# Patient Record
Sex: Male | Born: 1950
Health system: Southern US, Community
[De-identification: ages and names within clinical notes are randomized; demographics above are authoritative.]

## PROBLEM LIST (undated history)

## (undated) DIAGNOSIS — E039 Hypothyroidism, unspecified: Secondary | ICD-10-CM

## (undated) DIAGNOSIS — J45909 Unspecified asthma, uncomplicated: Secondary | ICD-10-CM

## (undated) DIAGNOSIS — Z8719 Personal history of other diseases of the digestive system: Secondary | ICD-10-CM

## (undated) DIAGNOSIS — J189 Pneumonia, unspecified organism: Secondary | ICD-10-CM

## (undated) DIAGNOSIS — D408 Neoplasm of uncertain behavior of other specified male genital organs: Secondary | ICD-10-CM

## (undated) DIAGNOSIS — J342 Deviated nasal septum: Secondary | ICD-10-CM

## (undated) DIAGNOSIS — E785 Hyperlipidemia, unspecified: Secondary | ICD-10-CM

## (undated) DIAGNOSIS — K5730 Diverticulosis of large intestine without perforation or abscess without bleeding: Secondary | ICD-10-CM

## (undated) HISTORY — PX: COLON SURGERY: SHX602

## (undated) HISTORY — DX: Hypothyroidism, unspecified: E03.9

## (undated) HISTORY — PX: INGUINAL HERNIA REPAIR: SUR1180

## (undated) HISTORY — DX: Hyperlipidemia, unspecified: E78.5

## (undated) HISTORY — DX: Pneumonia, unspecified organism: J18.9

## (undated) HISTORY — PX: HERNIA REPAIR: SHX51

---
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
## 1971-01-11 HISTORY — PX: PILONIDAL CYST EXCISION: SHX744

## 1980-01-11 HISTORY — PX: NASAL SEPTUM SURGERY: SHX37

## 1982-01-10 HISTORY — PX: HYDROCELE EXCISION: SHX482

## 2005-02-03 ENCOUNTER — Ambulatory Visit: Payer: Self-pay | Admitting: Cardiology

## 2005-02-07 ENCOUNTER — Emergency Department (HOSPITAL_COMMUNITY): Admission: EM | Admit: 2005-02-07 | Discharge: 2005-02-07 | Payer: Self-pay | Admitting: Emergency Medicine

## 2005-10-27 ENCOUNTER — Emergency Department (HOSPITAL_COMMUNITY): Admission: EM | Admit: 2005-10-27 | Discharge: 2005-10-27 | Payer: Self-pay | Admitting: Family Medicine

## 2008-03-17 ENCOUNTER — Ambulatory Visit (HOSPITAL_COMMUNITY): Admission: RE | Admit: 2008-03-17 | Discharge: 2008-03-17 | Payer: Self-pay | Admitting: Internal Medicine

## 2008-04-02 ENCOUNTER — Encounter: Admission: RE | Admit: 2008-04-02 | Discharge: 2008-04-02 | Payer: Self-pay | Admitting: Internal Medicine

## 2008-10-16 ENCOUNTER — Encounter: Admission: RE | Admit: 2008-10-16 | Discharge: 2008-10-16 | Payer: Self-pay | Admitting: Internal Medicine

## 2009-01-05 ENCOUNTER — Encounter: Payer: Self-pay | Admitting: Internal Medicine

## 2009-01-06 ENCOUNTER — Encounter (INDEPENDENT_AMBULATORY_CARE_PROVIDER_SITE_OTHER): Payer: Self-pay | Admitting: *Deleted

## 2009-02-19 ENCOUNTER — Ambulatory Visit: Payer: Self-pay | Admitting: Internal Medicine

## 2009-02-19 DIAGNOSIS — K5732 Diverticulitis of large intestine without perforation or abscess without bleeding: Secondary | ICD-10-CM | POA: Insufficient documentation

## 2009-02-19 DIAGNOSIS — R197 Diarrhea, unspecified: Secondary | ICD-10-CM | POA: Insufficient documentation

## 2009-02-19 DIAGNOSIS — R933 Abnormal findings on diagnostic imaging of other parts of digestive tract: Secondary | ICD-10-CM | POA: Insufficient documentation

## 2009-02-20 ENCOUNTER — Encounter: Payer: Self-pay | Admitting: Internal Medicine

## 2009-03-04 ENCOUNTER — Telehealth: Payer: Self-pay | Admitting: Internal Medicine

## 2009-03-04 ENCOUNTER — Ambulatory Visit: Payer: Self-pay | Admitting: Internal Medicine

## 2009-03-04 DIAGNOSIS — R1084 Generalized abdominal pain: Secondary | ICD-10-CM | POA: Insufficient documentation

## 2009-03-04 LAB — CONVERTED CEMR LAB: BUN: 14 mg/dL (ref 6–23)

## 2009-03-06 ENCOUNTER — Ambulatory Visit: Payer: Self-pay | Admitting: Internal Medicine

## 2009-03-06 ENCOUNTER — Encounter (INDEPENDENT_AMBULATORY_CARE_PROVIDER_SITE_OTHER): Payer: Self-pay | Admitting: *Deleted

## 2009-03-09 ENCOUNTER — Ambulatory Visit: Payer: Self-pay | Admitting: Internal Medicine

## 2009-03-12 ENCOUNTER — Ambulatory Visit: Payer: Self-pay | Admitting: Internal Medicine

## 2009-08-03 ENCOUNTER — Ambulatory Visit (HOSPITAL_COMMUNITY): Admission: RE | Admit: 2009-08-03 | Discharge: 2009-08-03 | Payer: Self-pay | Admitting: Internal Medicine

## 2009-08-03 LAB — HM DEXA SCAN: HM Dexa Scan: NORMAL

## 2009-09-18 ENCOUNTER — Encounter: Admission: RE | Admit: 2009-09-18 | Discharge: 2009-09-18 | Payer: Self-pay | Admitting: Internal Medicine

## 2009-09-18 ENCOUNTER — Ambulatory Visit (HOSPITAL_COMMUNITY): Admission: RE | Admit: 2009-09-18 | Discharge: 2009-09-18 | Payer: Self-pay | Admitting: Internal Medicine

## 2009-10-19 ENCOUNTER — Telehealth: Payer: Self-pay | Admitting: Internal Medicine

## 2010-01-31 ENCOUNTER — Encounter: Payer: Self-pay | Admitting: Internal Medicine

## 2010-02-09 NOTE — Assessment & Plan Note (Signed)
Summary: ABDOMINAL PAIN AND DIARRHEA   History of Present Illness Visit Type: consult  Primary GI MD: Yancey Flemings MD Primary Provider: Marisue Brooklyn, MD  Requesting Provider: Lucky Cowboy, MD  Chief Complaint: Generalized abd pain, acid reflux, belching, bloating, chest pain, and diarrhea  History of Present Illness:   60 year old white male with history ofhypothyroidism, hyperlipidemia, and asthma. He is sent today by Dr. Elisabeth Most for evaluation of abdominal pain and change of bowel habits. The patient's history dates back to November 12, 2008 when after eating fish and a whole artichoke he developed abdominal cramping and diarrhea. He was concerned that he may have a parasite. He saw Dr. Elisabeth Most on December 3 and was prescribed a probiotic. Symptoms waxed and waned through December until the began to develop focal left-sided abdominal pain. On January 05, 2009 a CT scan of the abdomen and pelvis was performed (reviewed) and revealed probable sigmoid diverticulitis without complicating features. Review of blood work that same day revealed a normal CBC, comprehensive metabolic panel, amylase, lipase, and TSH. He was treated with metronidazole and ciprofloxacin for one week with improvement in pain. Over the past month his bowel frequency has improved from 6-8 loose stools per day to 1-2. Or form over the past week. Some recurrence or worsening of the left-sided pain as previous. No nausea, vomiting, GI bleeding, or weight loss. He reports a negative screening colonoscopy, elsewhere, and 2003.also, a tendency toward IBS.   GI Review of Systems    Reports bloating.     Location of  Abdominal pain: left-sided.    Denies abdominal pain, acid reflux, belching, chest pain, dysphagia with liquids, dysphagia with solids, heartburn, loss of appetite, nausea, vomiting, vomiting blood, weight loss, and  weight gain.      Reports change in bowel habits, diarrhea, diverticulosis, and  irritable bowel  syndrome.     Denies anal fissure, black tarry stools, constipation, fecal incontinence, heme positive stool, hemorrhoids, jaundice, light color stool, liver problems, rectal bleeding, and  rectal pain.    Current Medications (verified): 1)  Aspir-Low 81 Mg Tbec (Aspirin) .... Two Tablets By Mouth Once Daily 2)  Coq10 100 Mg Caps (Coenzyme Q10) .Marland Kitchen.. 1 Tablet By Mouth Once Daily 3)  Synthroid 50 Mcg Tabs (Levothyroxine Sodium) .Marland Kitchen.. 1 Tablet By Mouth Once Daily 4)  Centrum  Tabs (Multiple Vitamins-Minerals) .Marland Kitchen.. 1 Tablet By Mouth Once Daily 5)  Vitamin C 1000 Mg Tabs (Ascorbic Acid) .... One Tablet By Mouth Once Daily 6)  Calcium-Vitamin D3 600-200 Mg-Unit Tabs (Calcium Carbonate-Vitamin D) .... One Tablet By Mouth Once Daily 7)  Sm Natural Omega-3 Fish Oil  Caps (Fish Oil-Borage-Flax-Safflower) .... One Tablet By Mouth Once Daily 8)  Calcium-Magnesium-Zinc 500-250-12.5 Mg Tabs (Calcium-Magnesium-Zinc) .... One Tablet By Mouth Two Times A Day 9)  B-100  Tabs (Vitamins-Lipotropics) .... One Tablet By Mouth Once Daily 10)  Vitamin B-12 1000 Mcg Tabs (Cyanocobalamin) .... One Tablet By Mouth Once Daily 11)  Androgel 25 Mg/2.5gm Gel (Testosterone) .... As Directed 12)  Flax Seed Oil 1000 Mg Caps (Flaxseed (Linseed)) .... One Tablet By Mouth Two Times A Day  Allergies (verified): No Known Drug Allergies  Past History:  Past Medical History: Hypothyroidism Anal Fissure Asthma Diverticulosis Hyperlipidemia Irritable Bowel Syndrome Pneumonia  Past Surgical History: Reviewed history from 02/16/2009 and no changes required. Tonsillectomy Hernia Surgery x 2 pilonidal cysts x 3 Nose Surgery Foot Surgery Deviated Septum Surgery Rt. Testicle and Lt. Testicle  Family History: Family History of Breast Cancer:Mother Family  History of Prostate Cancer:Paternal Uncle and Father No FH of Colon Cancer: Family History of Heart Disease: Father and MGF   Social History: Occupation: Self  Employed (puts on furniture shows) Married  No childern Patient has never smoked.  Alcohol Use - yes: 2 daily  Daily Caffeine Use: 3-4 daily  Illicit Drug Use - no Smoking Status:  never Drug Use:  no  Review of Systems       fatigue, ankle swelling. All other systems reviewed and reported as negative  Vital Signs:  Patient profile:   60 year old male Height:      67 inches Weight:      234 pounds BMI:     36.78 BSA:     2.16 Pulse rate:   74 / minute Pulse rhythm:   regular BP sitting:   126 / 80  (left arm) Cuff size:   regular  Vitals Entered By: Ok Anis CMA (February 19, 2009 11:05 AM)  Physical Exam  General:  Well developed, well nourished, no acute distress. Head:  Normocephalic and atraumatic. Eyes:  PERRLA, no icterus. Ears:  Normal auditory acuity. Nose:  No deformity, discharge,  or lesions. Mouth:  No deformity or lesions, dentition normal. Neck:  Supple; no masses or thyromegaly. Lungs:  Clear throughout to auscultation. Heart:  Regular rate and rhythm; no murmurs, rubs,  or bruits. Abdomen:  Softand nondistended. Some tenderness in the left upper quadrant to palpation. No rebound.. No masses, hepatosplenomegaly or hernias noted. Normal bowel sounds. Msk:  Symmetrical with no gross deformities. Normal posture. Pulses:  Normal pulses noted. Extremities:  trace edema left ankle Neurologic:  Alert and  oriented x4;  grossly normal neurologically. Skin:  Intact without significant lesions or rashes. Psych:  Alert and cooperative. Normal mood and affect.   Impression & Recommendations:  Problem # 1:  DIARRHEA-PRESUMED INFECTIOUS (ICD-009.3) Initial illness with abdominal cramping and diarrhea consistent with infectious gastroenteritis. Subsequent symptoms suggesting postinfectious irritable bowel type picture. There has been gradual and ongoing improvement with time. I expect this trend to continue.  Plan: #1. Prescribed Levbid 0.375 mg p.o. b.i.d.  p.r.n. #2. Probiotic align one daily for 2 weeks. Samples given #3. Reassurance #4. Routine office followup in 4-6 weeks  Problem # 2:  DIVERTICULITIS-COLON (ICD-562.11) Diverticulitis. Both clinically and by CT scan. He seems to have improved after one week of antibiotic therapy. Now with what sounds like smoldering symptoms and possible early recurrence.  Plan: #1. Prescribe Cipro 500 mg b.i.d. x10 days #2. Prescribe metronidazole 500 mg p.o. b.i.d. x10 days #3. Office followup in 4-6 weeks. Contact the office in the interim for questions or problems  Problem # 3:  ABNORMAL FINDINGS GI TRACT (ICD-793.4) Assessment: Comment Only abnormal findings consistent with diverticulitis. Treatment plan as above.  Patient Instructions: 1)  Please pick up your medications at your pharmacy. 2)  Samples of Align given.  Take this for 2 weeks. 3)  Please schedule a follow-up appointment in 4 to 6 weeks.  4)  Copy sent to :Marisue Brooklyn, DO 5)  The medication list was reviewed and reconciled.  All changed / newly prescribed medications were explained.  A complete medication list was provided to the patient / caregiver. Prescriptions: FLAGYL 500 MG TABS (METRONIDAZOLE) Take 1 tablet by mouth two times a day for 10 days  #20 x 0   Entered by:   Francee Piccolo CMA (AAMA)   Authorized by:   Hilarie Fredrickson MD   Signed by:  Hilarie Fredrickson MD on 02/19/2009   Method used:   [facility] [address] [phone]* (retail)       [address]       [place], [place]  [address]       Ph: [phone] or [phone]       Fax: [phone]   RxID:   [id number] CIPRO 500 MG TABS (CIPROFLOXACIN HCL) Take 1 tablet by mouth two times a day for 10 days  #20 x 0   Entered by:   Francee Piccolo CMA (AAMA)   Authorized by:   Hilarie Fredrickson MD   Signed by:   Hilarie Fredrickson MD on 02/19/2009   Method used:   [facility] to        [facility] [address] [phone]*  (retail)       [address]       [place], [place]  [address]       Ph: [phone] or [phone]       Fax: [phone]   RxID:   [id number] LEVBID 0.375 MG XR12H-TAB (HYOSCYAMINE SULFATE) Take 1 tablet by mouth two times a day as needed  #60 x 0   Entered by:   Francee Piccolo CMA (AAMA)   Authorized by:   Hilarie Fredrickson MD   Signed by:   Hilarie Fredrickson MD on 02/19/2009   Method used:   [facility]ically to        [facility] [address] [phone]* (retail)       [address]       [place], [place]  [address]       Ph: [phone] or [phone]       Fax: [phone]   RxID:   [id number]

## 2010-02-09 NOTE — Progress Notes (Signed)
Summary: billing concern  Phone Note Call from Patient Call back at Home Phone [phone]   Caller: wife Call For: Dr. Marina Goodell Reason for Call: Talk to Nurse Summary of Call: billing complications and trouble with billing manager, "Victorino Dike"... specifically asked to speak to the office manager with Dr. Marina Goodell... doesnt think that our office is getting paid as pt and wife have not been ale to make a payment with the new billing dept Initial call taken by: Vallarie Mare,  October 19, 2009 8:51 AM  Follow-up for Phone Call        Left message on patient's answering machine to call me back. Follow-up by: Clarnce Flock,  October 22, 2009 9:33 AM  Additional Follow-up for Phone Call Additional follow up Details #1::        Left another message to call me if they are still in need of help with the billing office. Additional Follow-up by: Clarnce Flock,  October 28, 2009 4:07 PM

## 2010-02-09 NOTE — Procedures (Signed)
Summary: Colonoscopy  Patient: [name] Note: All result statuses are Final unless otherwise noted.  Tests: (1) Colonoscopy (COL)   COL Colonoscopy           DONE     [facility]     [address]     [place], [place]  [address]           COLONOSCOPY PROCEDURE REPORT           PATIENT:  Eric Cordova, Eric Cordova  MR#:  [id number]     BIRTHDATE:  [birth year], 59 yrs. old  GENDER:  male           ENDOSCOPIST:  Wilhemina Bonito. Eda Keys, MD     Referred by:  Marisue Brooklyn, D.O.           PROCEDURE DATE:  03/12/2009     PROCEDURE:  Surveillance Colonoscopy     ASA CLASS:  Class II     INDICATIONS:  history of hyperplastic polyps ; In [place] 8 years     ago (no details; patient not sure who performed the exam) ; Recent     diverticulitis.still w/ left sided pain but recent CT shows     resolution of diverticulitis           MEDICATIONS:   Fentanyl 50 mcg IV, Versed 7 mg IV, Benadryl 50 mg     IV           DESCRIPTION OF PROCEDURE:   After the risks benefits and     alternatives of the procedure were thoroughly explained, informed     consent was obtained.  Digital rectal exam was performed and     revealed no abnormalities.   The LB [id number] endoscope     was introduced through the anus and advanced to the cecum, which     was identified by both the appendix and ileocecal valve, without     limitations. Time to cecum = 2:08 min.The quality of the prep was     excellent, using MoviPrep.  The instrument was then slowly     withdrawn (time = 6:50 min) as the colon was fully examined.     <<PROCEDUREIMAGES>>           FINDINGS:  Severe diverticulosis was found throughout the colon.     This was otherwise a normal examination of the colon.  No polyps or     cancers were seen.   Retroflexed views in the rectum revealed no     abnormalities.    The scope was then withdrawn from the patient     and the procedure completed.           COMPLICATIONS:  None        ENDOSCOPIC IMPRESSION:     1) Severe diverticulosis throughout the colon     2) Otherwise normal examination     3) No polyps or cancers     RECOMMENDATIONS:     1) Continue current colorectal screening recommendations for     "routine risk" patients with a repeat colonoscopy in 10 years.     2) Office follow up in 2 months           ______________________________     Wilhemina Bonito. Eda Keys, MD           CC:  Marisue Brooklyn, DO; The Patient           n.     eSIGNED:  Wilhemina Bonito. Eda Keys at 03/12/2009 02:38 PM           Roylene Reason, [id number]  Note: An exclamation mark (!) indicates a result that was not dispersed into the flowsheet. Document Creation Date: 03/12/2009 2:38 PM _______________________________________________________________________  (1) Order result status: Final Collection or observation date-time: 03/12/2009 14:32 Requested date-time:  Receipt date-time:  Reported date-time:  Referring Physician:   Ordering Physician: Fransico Setters [phone]) Specimen Source:  Source: Launa Grill Order Number: [id number] Lab site:   Appended Document: Colonoscopy    Clinical Lists Changes  Observations: Added new observation of COLONNXTDUE: 03/2019 (03/12/2009 16:14)

## 2010-02-09 NOTE — Letter (Signed)
Summary: Financial trader  [employer] Authorization   Imported By: Roderic Ovens 04/08/2009 12:15:01  _____________________________________________________________________  External Attachment:    Type:   Image     Comment:   External Document

## 2010-02-09 NOTE — Miscellaneous (Signed)
Summary: Flagyl rx new pharm  Clinical Lists Changes  Medications: Rx of FLAGYL 500 MG TABS (METRONIDAZOLE) Take 1 tablet by mouth two times a day for 10 days;  #20 x 0;  Signed;  Entered by: Harlow Mares CMA (AAMA);  Authorized by: Hilarie Fredrickson MD;  Method used: Faxed to [facility], [address] [place], [place], [place]  [address], Ph: [phone], Fax: [phone]    Prescriptions: FLAGYL 500 MG TABS (METRONIDAZOLE) Take 1 tablet by mouth two times a day for 10 days  #20 x 0   Entered by:   Harlow Mares CMA (AAMA)   Authorized by:   Hilarie Fredrickson MD   Signed by:   Harlow Mares CMA (AAMA) on 02/20/2009   Method used:   Faxed to ...       [facility] (retail)       [address]       [place], [place]  [address]       Ph: [phone]       Fax: [phone]   RxID:   [id number]  gave patient new pharm info.

## 2010-02-09 NOTE — Letter (Signed)
Summary: [facility] Instructions  [facility]  [address] [place], [place] [address]   Phone: [phone]  Fax: [phone]       [facility]    [birth year]    MRN: [id number]        Procedure Day Dorna Bloom:  Lenor Coffin  03/12/09     Arrival Time:  1:00PM     Procedure Time:  2:00PM     Location of Procedure:                    _ X_  [facility] (4th Floor)                        PREPARATION FOR COLONOSCOPY WITH MOVIPREP   Starting 5 days prior to your procedure 03/07/09 do not eat nuts, seeds, popcorn, corn, beans, peas,  salads, or any raw vegetables.  Do not take any fiber supplements (e.g. Metamucil, Citrucel, and Benefiber).  THE DAY BEFORE YOUR PROCEDURE         DATE: 03/11/09  DAY: WEDNESDAY  1.  Drink clear liquids the entire day-NO SOLID FOOD  2.  Do not drink anything colored red or purple.  Avoid juices with pulp.  No orange juice.  3.  Drink at least 64 oz. (8 glasses) of fluid/clear liquids during the day to prevent dehydration and help the prep work efficiently.  CLEAR LIQUIDS INCLUDE: Water Jello Ice Popsicles Tea (sugar ok, no milk/cream) Powdered fruit flavored drinks Coffee (sugar ok, no milk/cream) Gatorade Juice: apple, white grape, white cranberry  Lemonade Clear bullion, consomm, broth Carbonated beverages (any kind) Strained chicken noodle soup Hard Candy                             4.  In the morning, mix first dose of MoviPrep solution:    Empty 1 Pouch A and 1 Pouch B into the disposable container    Add lukewarm drinking water to the top line of the container. Mix to dissolve    Refrigerate (mixed solution should be used within 24 hrs)  5.  Begin drinking the prep at 5:00 p.m. The MoviPrep container is divided by 4 marks.   Every 15 minutes drink the solution down to the next mark (approximately 8 oz) until the full liter is complete.   6.  Follow completed prep with 16 oz of clear liquid of your choice  (Nothing red or purple).  Continue to drink clear liquids until bedtime.  7.  Before going to bed, mix second dose of MoviPrep solution:    Empty 1 Pouch A and 1 Pouch B into the disposable container    Add lukewarm drinking water to the top line of the container. Mix to dissolve    Refrigerate  THE DAY OF YOUR PROCEDURE      DATE: 03/12/09  DAY: THURSDAY  Beginning at 9:00AM (5 hours before procedure):         1. Every 15 minutes, drink the solution down to the next mark (approx 8 oz) until the full liter is complete.  2. Follow completed prep with 16 oz. of clear liquid of your choice.    3. You may drink clear liquids until 12:00PM (2 HOURS BEFORE PROCEDURE).   MEDICATION INSTRUCTIONS  Unless otherwise instructed, you should take regular prescription medications with a small sip of water   as early as possible the morning  of your procedure.    Additional medication instructions: n/a         OTHER INSTRUCTIONS  You will need a responsible adult at least 59 years of age to accompany you and drive you home.   This person must remain in the waiting room during your procedure.  Wear loose fitting clothing that is easily removed.  Leave jewelry and other valuables at home.  However, you may wish to bring a book to read or  an iPod/MP3 player to listen to music as you wait for your procedure to start.  Remove all body piercing jewelry and leave at home.  Total time from sign-in until discharge is approximately 2-3 hours.  You should go home directly after your procedure and rest.  You can resume normal activities the  day after your procedure.  The day of your procedure you should not:   Drive   Make legal decisions   Operate machinery   Drink alcohol   Return to work  You will receive specific instructions about eating, activities and medications before you leave.    The above instructions have been reviewed and explained to me by  Sherren Kerns RN   March 09, 2009 9:53 AM      I fully understand and can verbalize these instructions _____________________________ Date _________

## 2010-02-09 NOTE — Miscellaneous (Signed)
Summary: previsit/rm  Clinical Lists Changes  Medications: Added new medication of MOVIPREP 100 GM  SOLR (PEG-KCL-NACL-NASULF-NA ASC-C) As per prep instructions. - Signed Rx of MOVIPREP 100 GM  SOLR (PEG-KCL-NACL-NASULF-NA ASC-C) As per prep instructions.;  #1 x 0;  Signed;  Entered by: Sherren Kerns RN;  Authorized by: Hilarie Fredrickson MD;  Method used: [facility] [address] [phone]*, [address], [place], [place], [place]  [address], Ph: [phone] or [phone], Fax: [phone] Observations: Added new observation of ALLERGY REV: Done (03/09/2009 9:28)    Prescriptions: MOVIPREP 100 GM  SOLR (PEG-KCL-NACL-NASULF-NA ASC-C) As per prep instructions.  #1 x 0   Entered by:   Sherren Kerns RN   Authorized by:   Hilarie Fredrickson MD   Signed by:   Sherren Kerns RN on 03/09/2009   Method used:   Electronically to        [facility] [address] [phone]* (retail)       [address]       [place], [place]  [address]       Ph: [phone] or [phone]       Fax: [phone]   RxID:   [id number]

## 2010-02-09 NOTE — Progress Notes (Signed)
Summary: triage / pain  Phone Note Call from Patient Call back at Work Phone [phone]   Caller: Patient Call For: Marina Goodell Reason for Call: Talk to Nurse Summary of Call: Patient states that he has "terrible pain" on his left side arouen his ric cage wants to know what to do.  Initial call taken by: Tawni Levy,  March 04, 2009 10:08 AM  Follow-up for Phone Call        Continues to have left sided abd. pain same as when seen Describes area as mostly under rib cage but does also occur in lower LLQ. Pain is described as throbbing and sharp with pain level currently a 3. Continues with loose stools of 1-3 a day.No blood or mucus seen. No shaking chills or fever.Finished medication yesterday. Follow-up by: Teryl Lucy RN,  March 04, 2009 10:22 AM  Additional Follow-up for Phone Call Additional follow up Details #1::        Let' set him up for a follow up CT of the abdomen and pelvis (with contrast) Additional Follow-up by: Hilarie Fredrickson MD,  March 04, 2009 11:15 AM  New Problems: ABDOMINAL PAIN, GENERALIZED (ICD-789.07)   New Problems: ABDOMINAL PAIN, GENERALIZED (ICD-789.07)  Appended Document: triage / pain  Pt. scheduled for CT at [facility] on Friday-03/06/2009 at 9:30 a.m. Will pick up drink and instructions and have labs done.

## 2010-03-29 ENCOUNTER — Ambulatory Visit (HOSPITAL_COMMUNITY)
Admission: RE | Admit: 2010-03-29 | Discharge: 2010-03-29 | Disposition: A | Discharge status: Home / Self Care | Payer: BC Managed Care – PPO | Source: Ambulatory Visit | Attending: Internal Medicine | Admitting: Internal Medicine

## 2010-03-29 ENCOUNTER — Other Ambulatory Visit (HOSPITAL_COMMUNITY): Payer: Self-pay | Admitting: Internal Medicine

## 2010-03-29 DIAGNOSIS — R05 Cough: Secondary | ICD-10-CM

## 2010-03-29 DIAGNOSIS — R059 Cough, unspecified: Secondary | ICD-10-CM | POA: Insufficient documentation

## 2010-10-07 ENCOUNTER — Other Ambulatory Visit: Payer: Self-pay

## 2010-10-07 DIAGNOSIS — M7989 Other specified soft tissue disorders: Secondary | ICD-10-CM

## 2010-10-12 ENCOUNTER — Encounter (INDEPENDENT_AMBULATORY_CARE_PROVIDER_SITE_OTHER): Payer: Self-pay

## 2010-10-12 ENCOUNTER — Other Ambulatory Visit (INDEPENDENT_AMBULATORY_CARE_PROVIDER_SITE_OTHER): Payer: Self-pay

## 2010-10-19 ENCOUNTER — Encounter (INDEPENDENT_AMBULATORY_CARE_PROVIDER_SITE_OTHER): Payer: Self-pay | Admitting: General Surgery

## 2010-10-19 ENCOUNTER — Ambulatory Visit (INDEPENDENT_AMBULATORY_CARE_PROVIDER_SITE_OTHER): Payer: BC Managed Care – PPO | Admitting: General Surgery

## 2010-10-19 VITALS — BP 132/90 | HR 64 | Temp 97.4°F | Resp 16 | Ht 67.0 in | Wt 216.0 lb

## 2010-10-19 DIAGNOSIS — K5730 Diverticulosis of large intestine without perforation or abscess without bleeding: Secondary | ICD-10-CM | POA: Insufficient documentation

## 2010-10-19 NOTE — Patient Instructions (Signed)
Try to retrieve pictures from previous colonoscopy from Dr. Lamar Sprinkles office. If we can get the barium enema performed this week we can plan for surgery in the near future based on these findings.

## 2010-10-19 NOTE — Progress Notes (Signed)
Chief Complaint  Patient presents with  . Other    new pt- eval diverticulitis    HPI Eric Cordova is a 60 y.[place]ordova. male.      HPI  The patient comes in with a history of recurrent bouts of diverticulitis. Looking through the patient's notes presented to me by Dr. Marisue Brooklyn the last CT scan noted in the patient's chart was from July of 2011. At that time he had some mild stranding in the area of his colon however when you talk to the patient and most of his symptoms are in the left upper quadrant and not in the left lower quadrant.  According to the patient he had a colonoscopy performed by Dr. Marina Goodell in the midportion of 2011 which showed that he had some pan diverticulosis of the colon. Further workup was not performed at that time as there was little concerned that he would have an acute process.  The patient comes in now because he had a brother who had a ruptured diverticulitis requiring a colostomy and a subsequent colostomy takedown. He lives to avoid further complications of his acute diverticulitis. He is currently taking metronidazole and ciprofloxacin but only has about 10 tablets of each left.  Past Medical History  Diagnosis Date  . Hypothyroidism   . Diverticulitis   . Degenerative joint disease   . Asthma   . Allergy   . Constipation   . Pneumonia   . Hyperlipidemia     Past Surgical History  Procedure Date  . Tonsillectomy 1958  . Hernia repair 1960, 1974    inguinal bilateral  . Pilonidal cyst excision 1973  . Nasal septum surgery 1982  . Hydrocele excision 1984    Family History  Problem Relation Age of Onset  . Cancer Mother     breast  . Cancer Father     prostate  . Cancer Maternal Uncle     prostate    Social History History  Substance Use Topics  . Smoking status: Never Smoker   . Smokeless tobacco: Not on file  . Alcohol Use: Yes    No Known Allergies  Current Outpatient Prescriptions  Medication Sig Dispense Refill  .  ALPRAZolam (XANAX) 0.5 MG tablet Ad lib.      Marland Kitchen aspirin 325 MG tablet Take 325 mg by mouth daily.        . Calcium Carbonate-Vit D-Min (CALCIUM 1200 PO) Take by mouth daily.        . Cholecalciferol (VITAMIN D-3 PO) Take 2,000 mg by mouth daily.        . ciprofloxacin (CIPRO) 500 MG tablet BID times 48H.      . fish oil-omega-3 fatty acids 1000 MG capsule Take 2 g by mouth daily.        . Flaxseed, Linseed, (FLAXSEED OIL PO) Take by mouth daily.        . fluocinolone (SYNALAR) 0.01 % external solution daily.      Marland Kitchen levothyroxine (SYNTHROID, LEVOTHROID) 100 MCG tablet Take 100 mcg by mouth daily.        Marland Kitchen METRONIDAZOLE EX Apply topically.        . Multiple Vitamin (MULTIVITAMIN) capsule Take 1 capsule by mouth daily.        . Red Yeast Rice Extract (RED YEAST RICE PO) Take by mouth daily.        . Testosterone (ANDROGEL TD) Place 1.62 mg onto the skin daily.         Review  of Systems Review of Systems  Gastrointestinal: Positive for nausea, abdominal pain (LUQ and left mid abdomen), diarrhea and abdominal distention (LUQ and mid abdomen). Negative for vomiting, constipation, blood in stool and rectal pain.  Genitourinary: Negative.   Musculoskeletal: Negative.   Neurological: Negative.   Hematological: Negative.     Blood pressure 132/90, pulse 64, temperature 97.4 F (36.3 C), temperature source Temporal, resp. rate 16, height 5\' 7"  (1.702 m), weight 216 lb (97.977 kg).  Physical Exam Physical Exam  Constitutional: He is oriented to person, place, and time. He appears well-developed and well-nourished.  HENT:  Head: Normocephalic and atraumatic.  Eyes: Pupils are equal, round, and reactive to light.  Neck: Normal range of motion. Neck supple.  Cardiovascular: Normal rate, normal heart sounds and intact distal pulses.   Abdominal: Soft. Bowel sounds are normal. He exhibits no distension and no mass. There is no tenderness. There is no rebound and no guarding.  Genitourinary:  Prostate normal and penis normal.  Musculoskeletal: Normal range of motion.  Neurological: He is alert and oriented to person, place, and time.  Skin: Skin is warm and dry.  Psychiatric: He has a normal mood and affect. His behavior is normal. Judgment and thought content normal.    Data Reviewed I have reviewed the information sent over by Dr. Kenna Gilbert office. There is no clear-cut evidence of wear of the diverticular process is and therefore further studies may be necessary the last CT scan of the abdomen and pelvis on the chart is from August 06, 2009 that showed left colonic diverticula with some mild stranding of the perisigmoid area. That would be the area that I would target for possible colectomy however further studies may be necessary.  Assessment    History of recurrent diverticulitis with pan diverticulosis of the colon. No clear-cut area of acute inflammation based on current studies that are available    Plan  A like to get a Gastrografin enema and/or a barium enema of the patient in order to delineate the extent of disease in the patient's colon. Also like for them to get the pictures from Dr. Lamar Sprinkles office from his previous colonoscopy. If we can get that information within a week and we could go ahead and plan elective sigmoid colectomy with primary anastomosis after a bowel prep.        Eric Cordova,Eric Cordova 10/19/2010, 6:04 PM

## 2010-10-20 ENCOUNTER — Other Ambulatory Visit (INDEPENDENT_AMBULATORY_CARE_PROVIDER_SITE_OTHER): Payer: Self-pay

## 2010-10-20 DIAGNOSIS — K5792 Diverticulitis of intestine, part unspecified, without perforation or abscess without bleeding: Secondary | ICD-10-CM

## 2010-10-21 ENCOUNTER — Telehealth (INDEPENDENT_AMBULATORY_CARE_PROVIDER_SITE_OTHER): Payer: Self-pay | Admitting: General Surgery

## 2010-10-21 NOTE — Telephone Encounter (Signed)
They will mail pictures from colonoscopy from 03/2009 today. They may not be here for appt but she is mailing so we have colored pictures.

## 2010-10-22 ENCOUNTER — Other Ambulatory Visit (HOSPITAL_COMMUNITY): Payer: BC Managed Care – PPO

## 2010-10-27 ENCOUNTER — Other Ambulatory Visit: Payer: BC Managed Care – PPO

## 2010-10-27 ENCOUNTER — Encounter: Payer: BC Managed Care – PPO | Admitting: Vascular Surgery

## 2010-10-28 ENCOUNTER — Encounter (INDEPENDENT_AMBULATORY_CARE_PROVIDER_SITE_OTHER): Payer: BC Managed Care – PPO | Admitting: General Surgery

## 2010-10-28 ENCOUNTER — Other Ambulatory Visit (INDEPENDENT_AMBULATORY_CARE_PROVIDER_SITE_OTHER): Payer: Self-pay | Admitting: General Surgery

## 2010-10-28 ENCOUNTER — Ambulatory Visit (HOSPITAL_COMMUNITY)
Admission: RE | Admit: 2010-10-28 | Discharge: 2010-10-28 | Disposition: A | Discharge status: Home / Self Care | Payer: BC Managed Care – PPO | Source: Ambulatory Visit | Attending: General Surgery | Admitting: General Surgery

## 2010-10-28 DIAGNOSIS — K5792 Diverticulitis of intestine, part unspecified, without perforation or abscess without bleeding: Secondary | ICD-10-CM

## 2010-10-28 DIAGNOSIS — K5730 Diverticulosis of large intestine without perforation or abscess without bleeding: Secondary | ICD-10-CM | POA: Insufficient documentation

## 2010-10-28 DIAGNOSIS — R1011 Right upper quadrant pain: Secondary | ICD-10-CM | POA: Insufficient documentation

## 2010-11-09 ENCOUNTER — Encounter (HOSPITAL_COMMUNITY)
Admission: RE | Admit: 2010-11-09 | Discharge: 2010-11-09 | Disposition: A | Discharge status: Home / Self Care | Payer: BC Managed Care – PPO | Source: Ambulatory Visit | Attending: General Surgery | Admitting: General Surgery

## 2010-11-09 LAB — DIFFERENTIAL
Basophils Absolute: 0 10*3/uL (ref 0.0–0.1)
Basophils Relative: 1 % (ref 0–1)
Eosinophils Relative: 5 % (ref 0–5)
Lymphs Abs: 2 10*3/uL (ref 0.7–4.0)
Monocytes Relative: 12 % (ref 3–12)
Neutrophils Relative %: 51 % (ref 43–77)

## 2010-11-09 LAB — CBC
HCT: 44.7 % (ref 39.0–52.0)
Hemoglobin: 15.3 g/dL (ref 13.0–17.0)
Platelets: 144 10*3/uL — ABNORMAL LOW (ref 150–400)
RBC: 5.23 MIL/uL (ref 4.22–5.81)
RDW: 13 % (ref 11.5–15.5)
WBC: 6.4 10*3/uL (ref 4.0–10.5)

## 2010-11-09 LAB — BASIC METABOLIC PANEL
BUN: 18 mg/dL (ref 6–23)
CO2: 29 mEq/L (ref 19–32)
GFR calc non Af Amer: 69 mL/min — ABNORMAL LOW (ref >90)
Glucose, Bld: 123 mg/dL — ABNORMAL HIGH (ref 70–99)

## 2010-11-09 LAB — TYPE AND SCREEN: ABO/RH(D): O POS

## 2010-11-10 ENCOUNTER — Other Ambulatory Visit: Payer: BC Managed Care – PPO

## 2010-11-10 ENCOUNTER — Encounter: Payer: BC Managed Care – PPO | Admitting: Vascular Surgery

## 2010-11-12 ENCOUNTER — Other Ambulatory Visit (INDEPENDENT_AMBULATORY_CARE_PROVIDER_SITE_OTHER): Payer: Self-pay | Admitting: General Surgery

## 2010-11-12 ENCOUNTER — Inpatient Hospital Stay (HOSPITAL_COMMUNITY)
Admission: RE | Admit: 2010-11-12 | Discharge: 2010-11-16 | DRG: 149 | Disposition: A | Discharge status: Home / Self Care | Payer: BC Managed Care – PPO | Source: Ambulatory Visit | Attending: General Surgery | Admitting: General Surgery

## 2010-11-12 DIAGNOSIS — Z79899 Other long term (current) drug therapy: Secondary | ICD-10-CM

## 2010-11-12 DIAGNOSIS — K5730 Diverticulosis of large intestine without perforation or abscess without bleeding: Principal | ICD-10-CM | POA: Diagnosis present

## 2010-11-12 DIAGNOSIS — Z01812 Encounter for preprocedural laboratory examination: Secondary | ICD-10-CM

## 2010-11-12 DIAGNOSIS — K5732 Diverticulitis of large intestine without perforation or abscess without bleeding: Secondary | ICD-10-CM

## 2010-11-12 DIAGNOSIS — E039 Hypothyroidism, unspecified: Secondary | ICD-10-CM | POA: Diagnosis present

## 2010-11-12 DIAGNOSIS — Z7982 Long term (current) use of aspirin: Secondary | ICD-10-CM

## 2010-11-12 HISTORY — PX: LAPAROSCOPIC SIGMOID COLECTOMY: SHX5928

## 2010-11-13 MED ORDER — KCL IN DEXTROSE-NACL 20-5-0.45 MEQ/L-%-% IV SOLN
INTRAVENOUS | Status: DC
  Administered 2010-11-14 – 2010-11-15 (×2): via INTRAVENOUS
  Filled 2010-11-13 (×5): qty 1000

## 2010-11-13 MED ORDER — ONDANSETRON HCL 4 MG/2ML IJ SOLN: 4.0000 mg | Freq: Four times a day (QID) | INTRAMUSCULAR | Status: DC | PRN

## 2010-11-13 MED ORDER — ALVIMOPAN 12 MG PO CAPS
12.0000 mg | ORAL_CAPSULE | Freq: Two times a day (BID) | ORAL | Status: DC
  Administered 2010-11-14 – 2010-11-16 (×5): 12 mg via ORAL
  Filled 2010-11-13 (×8): qty 1

## 2010-11-13 MED ORDER — DIPHENHYDRAMINE HCL 12.5 MG/5ML PO ELIX
12.5000 mg | ORAL_SOLUTION | Freq: Four times a day (QID) | ORAL | Status: DC | PRN
  Filled 2010-11-13: qty 5

## 2010-11-13 MED ORDER — TESTOSTERONE 50 MG/5GM (1%) TD GEL
5.0000 g | Freq: Every day | TRANSDERMAL | Status: DC
  Administered 2010-11-16: 5 g via TRANSDERMAL

## 2010-11-13 MED ORDER — PANTOPRAZOLE SODIUM 40 MG PO TBEC
40.0000 mg | DELAYED_RELEASE_TABLET | Freq: Every day | ORAL | Status: DC
  Administered 2010-11-14 – 2010-11-16 (×3): 40 mg via ORAL
  Filled 2010-11-13 (×2): qty 1

## 2010-11-13 MED ORDER — FLINTSTONES/EXTRA C PO CHEW
2.0000 | CHEWABLE_TABLET | Freq: Every day | ORAL | Status: DC
  Administered 2010-11-14 – 2010-11-16 (×3): 2 via ORAL
  Filled 2010-11-13 (×4): qty 2

## 2010-11-13 MED ORDER — SODIUM CHLORIDE 0.9 % IJ SOLN: 9.0000 mL | INTRAMUSCULAR | Status: DC | PRN

## 2010-11-13 MED ORDER — CALCIUM CARBONATE-VITAMIN D 500-200 MG-UNIT PO TABS
1.0000 | ORAL_TABLET | Freq: Two times a day (BID) | ORAL | Status: DC
  Administered 2010-11-14 – 2010-11-16 (×5): 1 via ORAL
  Filled 2010-11-13 (×8): qty 1

## 2010-11-13 MED ORDER — LEVOTHYROXINE SODIUM 50 MCG PO TABS
50.0000 ug | ORAL_TABLET | ORAL | Status: DC
  Administered 2010-11-14 – 2010-11-16 (×2): 50 ug via ORAL
  Filled 2010-11-13 (×2): qty 1

## 2010-11-13 MED ORDER — NALOXONE HCL 0.4 MG/ML IJ SOLN: 0.4000 mg | INTRAMUSCULAR | Status: DC | PRN

## 2010-11-13 MED ORDER — OMEGA-3-ACID ETHYL ESTERS 1 G PO CAPS
1.0000 g | ORAL_CAPSULE | Freq: Three times a day (TID) | ORAL | Status: DC
  Administered 2010-11-14 – 2010-11-16 (×7): 1 g via ORAL
  Filled 2010-11-13 (×11): qty 1

## 2010-11-13 MED ORDER — LORATADINE 10 MG PO TABS
10.0000 mg | ORAL_TABLET | Freq: Every day | ORAL | Status: DC
  Administered 2010-11-15 – 2010-11-16 (×2): 10 mg via ORAL
  Filled 2010-11-13 (×4): qty 1

## 2010-11-13 MED ORDER — ENOXAPARIN SODIUM 40 MG/0.4ML ~~LOC~~ SOLN
40.0000 mg | SUBCUTANEOUS | Status: DC
  Administered 2010-11-14 – 2010-11-16 (×3): 40 mg via SUBCUTANEOUS
  Filled 2010-11-13 (×3): qty 0.4

## 2010-11-13 MED ORDER — MORPHINE SULFATE (PF) 1 MG/ML IV SOLN
INTRAVENOUS | Status: DC
  Administered 2010-11-14 (×3): 1.5 mg via INTRAVENOUS
  Administered 2010-11-14: 0 mg via INTRAVENOUS
  Administered 2010-11-14: 1.5 mL via INTRAVENOUS
  Administered 2010-11-15 (×4): 0 mg via INTRAVENOUS

## 2010-11-13 MED ORDER — DIPHENHYDRAMINE HCL 50 MG/ML IJ SOLN: 12.5000 mg | Freq: Four times a day (QID) | INTRAMUSCULAR | Status: DC | PRN

## 2010-11-13 MED ORDER — ALPRAZOLAM 0.25 MG PO TABS
0.1250 mg | ORAL_TABLET | Freq: Every evening | ORAL | Status: DC | PRN
  Administered 2010-11-14 – 2010-11-15 (×2): 0.125 mg via ORAL
  Filled 2010-11-13: qty 1

## 2010-11-13 MED ORDER — VITAMIN D3 25 MCG (1000 UNIT) PO TABS
2000.0000 [IU] | ORAL_TABLET | Freq: Every day | ORAL | Status: DC
  Administered 2010-11-14 – 2010-11-15 (×2): 2000 [IU] via ORAL
  Filled 2010-11-13 (×5): qty 2

## 2010-11-13 MED ORDER — LEVOTHYROXINE SODIUM 75 MCG PO TABS
75.0000 ug | ORAL_TABLET | ORAL | Status: DC
  Administered 2010-11-15: 75 ug via ORAL
  Filled 2010-11-13 (×2): qty 1

## 2010-11-13 MED ORDER — DIPHENHYDRAMINE HCL 12.5 MG/5ML PO ELIX
25.0000 mg | ORAL_SOLUTION | Freq: Four times a day (QID) | ORAL | Status: DC | PRN
  Filled 2010-11-13: qty 10

## 2010-11-13 MED ORDER — VITAMIN C 500 MG PO TABS
500.0000 mg | ORAL_TABLET | Freq: Two times a day (BID) | ORAL | Status: DC
  Administered 2010-11-14 – 2010-11-16 (×5): 500 mg via ORAL
  Filled 2010-11-13 (×8): qty 1

## 2010-11-14 MED ORDER — ALUM & MAG HYDROXIDE-SIMETH 400-400-40 MG/5ML PO SUSP
30.0000 mL | Freq: Four times a day (QID) | ORAL | Status: DC | PRN
  Filled 2010-11-14: qty 30

## 2010-11-14 MED ORDER — MORPHINE SULFATE (PF) 1 MG/ML IV SOLN
INTRAVENOUS | Status: AC
  Filled 2010-11-14: qty 25

## 2010-11-14 MED ORDER — ACETAMINOPHEN 325 MG PO TABS
650.0000 mg | ORAL_TABLET | Freq: Four times a day (QID) | ORAL | Status: DC | PRN
  Administered 2010-11-14: 650 mg via ORAL
  Filled 2010-11-14: qty 2

## 2010-11-14 NOTE — Progress Notes (Signed)
GS Progress Note Subjective: Patient is complaining of gaseous pain and headache. He's had no bowel movement or flatus. She has some mild nausea earlier  Objective: Vital signs in last 24 hours: Temp:  [98.2 F (36.8 C)-98.6 F (37 C)] 98.6 F (37 C) (11/04 1431) Pulse Rate:  [86-101] 101  (11/04 1431) Resp:  [16-20] 18  (11/04 1431) BP: (142-147)/(72-92) 146/90 mmHg (11/04 1431) SpO2:  [92 %-97 %] 94 % (11/04 1431)    Intake/Output from previous day: 11/03 0701 - 11/04 0700 In: 960 [I.V.:960] Out: 1125 [Urine:1125] Intake/Output this shift: Total I/O In: -  Out: 725 [Urine:725]  Lungs: Lungs are clear to auscultation  Abd: Abdomen is soft but appears to be diffusely mildly tender with active bowel sounds. He is not distended. There is no peritonitis there is minimal by mouth intake.  Extremities:  He has no evidence of DVT.  Neuro:  the patient is oriented x3 and only complaining of a mild headache   Lab Results: CBC  No results found for this basename: WBC:2,HGB:2,HCT:2,PLT:2 in the last 72 hours BMET No results found for this basename: NA:2,K:2,CL:2,CO2:2,GLUCOSE:2,BUN:2,CREATININE:2,CALCIUM:2 in the last 72 hours PT/INR No results found for this basename: LABPROT:2,INR:2 in the last 72 hours ABG No results found for this basename: PHART:2,PCO2:2,PO2:2,HCO3:2 in the last 72 hours  Studies/Results: No results found.  Anti-infectives: Anti-infectives    None      Assessment/Plan: s/p  Because of the patient's complaint of gaseous distention and headache a plan or intent and some Mylanta 2 and also some Tylenol for his headache.  I will keep his IV fluids going at 75 cc an hour until his by mouth intake improved significantly. No suppositories will be necessary. I will not appreciably increase his by mouth intake yet.  LOS: 2 days    Marta Lamas. Gae Bon, MD, FACS [phone] [facility] 11/14/2010

## 2010-11-15 MED ORDER — MORPHINE SULFATE 4 MG/ML IJ SOLN: 4.0000 mg | INTRAMUSCULAR | Status: DC | PRN

## 2010-11-15 MED ORDER — HYDROCODONE-ACETAMINOPHEN 5-325 MG PO TABS: 1.0000 | ORAL_TABLET | ORAL | Status: DC | PRN

## 2010-11-15 NOTE — Progress Notes (Signed)
GS Progress Note Subjective: The patient is doing wuite well.  Had flatus several times this morning, minimal pain.  No bowel movement yet.  Objective: Vital signs in last 24 hours: Temp:  [97.2 F (36.2 C)-99.1 F (37.3 C)] 99.1 F (37.3 C) (11/05 1000) Pulse Rate:  [65-101] 88  (11/05 1000) Resp:  [16-20] 18  (11/05 1200) BP: (123-149)/(70-90) 138/84 mmHg (11/05 1000) SpO2:  [93 %-98 %] 96 % (11/05 1200) Last BM Date: 11/12/10  Intake/Output from previous day: 11/04 0701 - 11/05 0700 In: 2161.5 [P.O.:360; I.V.:1801.5] Out: 2325 [Urine:2325] Intake/Output this shift: Total I/O In: 120 [P.O.:120] Out: 750 [Urine:750]  Lungs: Clear to auscultation  Abd: Soft, excellent BSs, flatus, no BM.  Extremities: No DVT signs or symptoms  Neuro: Completely intact  Lab Results: CBC   BMET  PT/INR  ABG   Studies/Results: Pathology results are not back yet.  Anti-infectives: Anti-infectives    None      Assessment/Plan: s/p  Advance diet Possible discharge tomorrow or the next day. SL IV PO pain meds  LOS: 3 days    Marta Lamas. Gae Bon, MD, FACS [phone] [facility] 11/15/2010

## 2010-11-16 ENCOUNTER — Other Ambulatory Visit: Payer: Self-pay | Admitting: Surgery

## 2010-11-16 MED ORDER — MULTIVITAMINS PO CAPS: 1.0000 | ORAL_CAPSULE | Freq: Every day | ORAL | Status: DC

## 2010-11-16 MED ORDER — HYDROCODONE-ACETAMINOPHEN 5-325 MG PO TABS: 1.0000 | ORAL_TABLET | ORAL | Status: AC | PRN

## 2010-11-16 NOTE — Progress Notes (Signed)
GS Progress Note Subjective: The patient had a bowel movement last night.  Will advance diet.  Objective: Vital signs in last 24 hours: Temp:  [97.8 F (36.6 C)-99.1 F (37.3 C)] 98 F (36.7 C) (11/06 9604) Pulse Rate:  [72-88] 72  (11/06 0637) Resp:  [16-20] 16  (11/06 0637) BP: (125-140)/(67-84) 136/67 mmHg (11/06 0637) SpO2:  [94 %-97 %] 95 % (11/06 0637) Last BM Date: 11/15/10  Intake/Output from previous day: 11/05 0701 - 11/06 0700 In: 1145 [P.O.:520; I.V.:625] Out: 1976 [Urine:1975; Stool:1] Intake/Output this shift:    Lungs: Clear to auscultation  Abd: Soft, good bowel sounds  Extremities: No DVT signs or symptoms  Neuro: Intact  Lab Results: CBC  No results found for this basename: WBC:2,HGB:2,HCT:2,PLT:2 in the last 72 hours BMET No results found for this basename: NA:2,K:2,CL:2,CO2:2,GLUCOSE:2,BUN:2,CREATININE:2,CALCIUM:2 in the last 72 hours PT/INR No results found for this basename: LABPROT:2,INR:2 in the last 72 hours ABG No results found for this basename: PHART:2,PCO2:2,PO2:2,HCO3:2 in the last 72 hours  Studies/Results: No results found.  Anti-infectives: Anti-infectives    None      Assessment/Plan: s/p  Advance diet If he does well with soft diet today, will discharge to home later on soft diet.  LOS: 4 days    Marta Lamas. Gae Bon, MD, FACS [phone] [facility] 11/16/2010

## 2010-11-17 NOTE — Discharge Summary (Signed)
Physician Discharge Summary  Patient ID: Eric Cordova MRN: [id number] DOB/AGE: Oct 22, 1950 60 y.o.  Admit date: 11/12/2010 Discharge date: 11/17/2010  Admission Diagnoses:  Discharge Diagnoses:  Active Problems:  * No active hospital problems. *    Discharged Condition: good  Hospital Course: The patient was admitted postoperatively after a sigmoid colectomy that was uneventful.  He was on a clear liquid diet starting POD #1.  This was advanced to a full liquid diet on POD #3.  He was advanced to Regular diet on POD #5 and discharged to home that afternoon  Consults: none  Significant Diagnostic Studies: Pathology  Treatments: surgery: Sigmoid colectomy  Discharge Exam: Blood pressure 136/67, pulse 72, temperature 98 F (36.7 C), temperature source Oral, resp. rate 16, height 5\' 7"  (1.702 m), weight 96.8 kg (213 lb 6.5 oz), SpO2 95.00%. Incision/Wound: Clean and dry without evidence of infection.  Disposition: Home or Self Care  Discharge Orders    Future Orders Please Complete By Expires   Diet - low sodium heart healthy      Increase activity slowly      Driving Restrictions      Comments:   No driving for one week   Lifting restrictions      Comments:   No lifting >20 lbs for 2 weeks   Change dressing (specify)      Comments:   Shower daily over incision.  Keep clean & dry. Dry dressing over area as needed. Call [facility] [phone] if incision red / draining / bleeding / or other concerns   Discharge instructions      Comments:   [facility] ______[facility] ABDOMINAL SURGERY: POST OP INSTRUCTIONS  Always review your discharge instruction sheet given to you by the facility where your surgery was performed. IF YOU HAVE DISABILITY OR FAMILY LEAVE FORMS, YOU MUST BRING THEM TO THE OFFICE FOR PROCESSING.   DO NOT GIVE THEM TO YOUR DOCTOR.  A prescription for pain medication may be given to you upon discharge.  Take your pain medication as prescribed,  if needed.  If narcotic pain medicine is not needed, then you may take acetaminophen (Tylenol) or ibuprofen (Advil) as needed. Take your usually prescribed medications unless otherwise directed. If you need a refill on your pain medication, please contact your pharmacy.  They will contact our office to request authorization. Prescriptions will not be filled after 5pm or on week-ends. You should follow a light diet the first few days after arrival home, such as soup and crackers, etc.  Be sure to include lots of fluids daily. Most patients will experience some swelling and bruising in the area of the incisions.  Ice packs will help.  Swelling and bruising can take several days to resolve.  It is common to experience some constipation if taking pain medication after surgery.  Increasing fluid intake and taking a stool softener (such as Colace) will usually help or prevent this problem from occurring.  A mild laxative (Milk of Magnesia or Miralax) should be taken according to package instructions if there are no bowel movements after 48 hours. Unless discharge instructions indicate otherwise, you may remove your bandages 24-48 hours after surgery, and you may shower at that time.  You may have steri-strips (small skin tapes) in place directly over the incision.  These strips should be left on the skin for 7-10 days.  If your surgeon used skin glue on the incision, you may shower in 24 hours.  The glue will flake  off over the next 2-3 weeks.  Any sutures or staples will be removed at the office during your follow-up visit. ACTIVITIES:  You may resume regular (light) daily activities beginning the next day-such as daily self-care, walking, climbing stairs-gradually increasing activities as tolerated.  You may have sexual intercourse when it is comfortable.  Refrain from any heavy lifting or straining until approved by your doctor. You may drive when you are no longer taking prescription pain medication, you can  comfortably wear a seatbelt, and you can safely maneuver your car and apply brakes. RETURN TO WORK:  __________________________________________________________ Bonita Quin should see your doctor in the office for a follow-up appointment approximately 2-3 weeks after your surgery.  Make sure that you call for this appointment within a day or two after you arrive home to insure a convenient appointment time. OTHER INSTRUCTIONS: __________________________________________________________________________________________________________________________ __________________________________________________________________________________________________________________________ WHEN TO CALL YOUR DOCTOR: Fever over 101.0 Inability to urinate Continued bleeding from incision. Increased pain, redness, or drainage from the incision. Increasing abdominal pain  The clinic staff is available to answer your questions during regular business hours.  Please don't hesitate to call and ask to speak to one of the nurses for clinical concerns.  If you have a medical emergency, go to the nearest emergency room or call 911.  A surgeon from [facility] is always on call at the hospital. [address], [place], [place]  [address] ? [address] [address], [place], [place]   [address] [phone] ? [phone] ? FAX [phone] Web site: [email or web address]   Call MD for:  temperature >100.4      Call MD for:  persistant nausea and vomiting      Call MD for:  severe uncontrolled pain      Call MD for:  redness, tenderness, or signs of infection (pain, swelling, redness, odor or green/yellow discharge around incision site)      Call MD for:  hives      Call MD for:  extreme fatigue        Discharge Medication List as of 11/16/2010  5:33 PM    START taking these medications   Details  HYDROcodone-acetaminophen (NORCO) 5-325 MG per tablet Take 1-2 tablets by mouth every 4 (four) hours as needed.,  Starting 11/16/2010, Until Fri 11/26/10, Print      CONTINUE these medications which have CHANGED   Details  Multiple Vitamin (MULTIVITAMIN) capsule Take 1 capsule by mouth daily., Starting 11/16/2010, Until Discontinued, Normal      CONTINUE these medications which have NOT CHANGED   Details  ALPRAZolam (XANAX) 0.5 MG tablet Take 0.25 mg by mouth at bedtime as needed. For sleep/anxiety, Starting 09/26/2010, Until Discontinued, Historical Med    aspirin 325 MG tablet Take 325 mg by mouth daily. , Until Discontinued, Historical Med    Calcium Carbonate-Vit D-Min (CALCIUM 1200 PO) Take 1 tablet by mouth 2 (two) times daily. , Until Discontinued, Historical Med    Cholecalciferol (VITAMIN D-3 PO) Take 2,000 mg by mouth daily.  , Until Discontinued, Historical Med    ciprofloxacin (CIPRO) 500 MG tablet 500 mg 2 (two) times daily. , Starting 09/28/2010, Until Discontinued, Historical Med    fenofibrate micronized (LOFIBRA) 134 MG capsule Take 134 mg by mouth daily before breakfast.  , Until Discontinued, Historical Med    fish oil-omega-3 fatty acids 1000 MG capsule Take 1 g by mouth 3 (three) times daily. , Until Discontinued, Historical Med    Flaxseed, Linseed, 1000 MG CAPS Take  1 capsule by mouth 2 (two) times daily.  , Until Discontinued, Historical Med    fluocinolone (SYNALAR) 0.01 % external solution daily., Starting 10/05/2010, Until Discontinued, Historical Med    levothyroxine (SYNTHROID, LEVOTHROID) 50 MCG tablet Take 50-75 mcg by mouth daily. 1.5 tablet on Monday, Wednesday and Friday; 1 tablet all other days of the week , Until Discontinued, Historical Med    loratadine (CLARITIN) 10 MG tablet Take 10 mg by mouth daily.  , Until Discontinued, Historical Med    Pediatric Multivit-Minerals-C (GUMMI BEAR MULTIVITAMIN/MIN) CHEW Chew 2 tablets by mouth daily.  , Until Discontinued, Historical Med    Red Yeast Rice Extract (RED YEAST RICE PO) Take 1 tablet by mouth 2 (two) times  daily. , Until Discontinued, Historical Med    Testosterone (ANDROGEL TD) Place 1.62 mg onto the skin daily. , Until Discontinued, Historical Med    vitamin C (ASCORBIC ACID) 500 MG tablet Take 500 mg by mouth 2 (two) times daily.  , Until Discontinued, Historical Med      STOP taking these medications     metroNIDAZOLE (FLAGYL) 500 MG tablet          Signed: Elon Eoff III,Makenzie Weisner O 11/17/2010, 11:15 AM

## 2010-11-23 ENCOUNTER — Encounter (INDEPENDENT_AMBULATORY_CARE_PROVIDER_SITE_OTHER): Payer: Self-pay | Admitting: Internal Medicine

## 2010-11-30 ENCOUNTER — Ambulatory Visit (INDEPENDENT_AMBULATORY_CARE_PROVIDER_SITE_OTHER): Payer: BC Managed Care – PPO | Admitting: General Surgery

## 2010-11-30 ENCOUNTER — Encounter (INDEPENDENT_AMBULATORY_CARE_PROVIDER_SITE_OTHER): Payer: Self-pay | Admitting: General Surgery

## 2010-11-30 VITALS — BP 126/84 | HR 72 | Temp 97.9°F | Resp 16 | Ht 67.0 in | Wt 206.6 lb

## 2010-11-30 DIAGNOSIS — Z09 Encounter for follow-up examination after completed treatment for conditions other than malignant neoplasm: Secondary | ICD-10-CM

## 2010-11-30 NOTE — Progress Notes (Signed)
HPI The patient is doing well status post sigmoid colectomy.  PE He is alert midline staples in place to be removed. He has no abdominal tenderness and no pain. He is eating well going to the bathroom well.  Studiy review None  Assessment Doing well status post sigmoid colectomy  Plan He will return to see me on a p.r.n. basis. Otherwise no need for a followup appointment. Staples will be removed prior to discharge today

## 2010-12-09 ENCOUNTER — Encounter (INDEPENDENT_AMBULATORY_CARE_PROVIDER_SITE_OTHER): Payer: Self-pay | Admitting: Emergency Medicine

## 2010-12-22 ENCOUNTER — Encounter (INDEPENDENT_AMBULATORY_CARE_PROVIDER_SITE_OTHER): Payer: Self-pay | Admitting: Gastroenterology

## 2011-02-16 ENCOUNTER — Encounter: Payer: Self-pay | Admitting: Vascular Surgery

## 2011-02-17 ENCOUNTER — Encounter: Payer: Self-pay | Admitting: Vascular Surgery

## 2011-02-17 ENCOUNTER — Other Ambulatory Visit: Payer: Self-pay

## 2011-07-04 ENCOUNTER — Encounter (INDEPENDENT_AMBULATORY_CARE_PROVIDER_SITE_OTHER): Payer: Self-pay

## 2011-08-16 ENCOUNTER — Encounter (INDEPENDENT_AMBULATORY_CARE_PROVIDER_SITE_OTHER): Payer: Self-pay | Admitting: General Surgery

## 2011-08-16 ENCOUNTER — Ambulatory Visit (INDEPENDENT_AMBULATORY_CARE_PROVIDER_SITE_OTHER): Payer: BC Managed Care – PPO | Admitting: General Surgery

## 2011-08-16 VITALS — BP 132/86 | HR 72 | Temp 97.3°F | Resp 16 | Ht 67.0 in | Wt 217.5 lb

## 2011-08-16 DIAGNOSIS — K432 Incisional hernia without obstruction or gangrene: Secondary | ICD-10-CM

## 2011-08-16 NOTE — Progress Notes (Signed)
Patient ID: Eric Cordova, male DOB: 10/15/1950, 61 y.o. MRN: [id number]  Chief Complaint   Patient presents with   .  Incisional Hernia    HPI  Eric Cordova is a 61 y.o. male. The patient comes in with an abdominal wall mass. He believes that it is a ventral hernia. He had some imaging done at an outside facility. The results are pending.  HPI  Past Medical History   Diagnosis  Date   .  Hypothyroidism    .  Diverticulitis    .  Degenerative joint disease    .  Asthma    .  Allergy    .  Constipation    .  Pneumonia    .  Hyperlipidemia     Past Surgical History   Procedure  Date   .  Tonsillectomy  1958   .  Hernia repair  1960, 1974     inguinal bilateral   .  Pilonidal cyst excision  1973   .  Nasal septum surgery  1982   .  Hydrocele excision  1984    Family History   Problem  Relation  Age of Onset   .  Cancer  Mother       breast    .  Cancer  Father       prostate    .  Cancer  Maternal Uncle       prostate    Social History  History   Substance Use Topics   .  Smoking status:  Never Smoker   .  Smokeless tobacco:  Never Used   .  Alcohol Use:  Yes    No Known Allergies  Current Outpatient Prescriptions   Medication  Sig  Dispense  Refill   .  ALPRAZolam (XANAX) 0.5 MG tablet  Take 0.25 mg by mouth at bedtime as needed. For sleep/anxiety     .  aspirin 325 MG tablet  Take 325 mg by mouth daily.     .  Calcium Carbonate-Vit D-Min (CALCIUM 1200 PO)  Take 1 tablet by mouth 2 (two) times daily.     .  Cholecalciferol (VITAMIN D-3 PO)  Take 2,000 mg by mouth daily.     .  fenofibrate micronized (LOFIBRA) 134 MG capsule  Take 134 mg by mouth daily before breakfast.     .  fish oil-omega-3 fatty acids 1000 MG capsule  Take 1 g by mouth 3 (three) times daily.     .  Flaxseed, Linseed, 1000 MG CAPS  Take 1 capsule by mouth 2 (two) times daily.     .  fluocinolone (SYNALAR) 0.01 % external solution  daily.     .  levothyroxine (SYNTHROID,  LEVOTHROID) 50 MCG tablet  Take 50-75 mcg by mouth daily. 1.5 tablet on Monday, Wednesday and Friday; 1 tablet all other days of the week     .  loratadine (CLARITIN) 10 MG tablet  Take 10 mg by mouth daily.     .  Multiple Vitamin (MULTIVITAMIN) capsule  Take 1 capsule by mouth daily.  30 capsule  0   .  Pediatric Multivit-Minerals-C (GUMMI BEAR MULTIVITAMIN/MIN) CHEW  Chew 2 tablets by mouth daily.     .  Red Yeast Rice Extract (RED YEAST RICE PO)  Take 1 tablet by mouth 2 (two) times daily.     .  Testosterone (ANDROGEL TD)  Place 1.62 mg onto the skin daily.     .  vitamin C (  ASCORBIC ACID) 500 MG tablet  Take 500 mg by mouth 2 (two) times daily.      Review of Systems  Review of Systems  Constitutional: Negative.  HENT: Negative.  Eyes: Negative.  Respiratory: Negative.  Cardiovascular: Negative.  Gastrointestinal: Positive for abdominal distention.  Genitourinary: Negative.  Musculoskeletal: Negative.  Neurological: Negative.  Hematological: Negative.  Psychiatric/Behavioral: Negative.   Blood pressure 132/86, pulse 72, temperature 97.3 F (36.3 C), temperature source Temporal, resp. rate 16, height 5' 7" (1.702 m), weight 217 lb 8 oz (98.657 kg).  Physical Exam  Physical Exam  Constitutional: He is oriented to person, place, and time. He appears well-developed and well-nourished.  HENT:  Head: Normocephalic and atraumatic.  Eyes: Conjunctivae and EOM are normal. Pupils are equal, round, and reactive to light.  Neck: Normal range of motion. Neck supple.  Cardiovascular: Normal rate, regular rhythm and normal heart sounds.  Pulmonary/Chest: Effort normal and breath sounds normal.  Abdominal: Soft. Bowel sounds are normal.    Musculoskeletal: Normal range of motion.  Neurological: He is alert and oriented to person, place, and time.  Skin: Skin is warm.  Psychiatric: He has a normal mood and affect. His behavior is normal. Judgment and thought content normal.   Data  Reviewed  I have not received the results from his CT scan done recently in July however by report suggests a possible incisional hernia.  Assessment   Incisional/ventral hernia status post sigmoid colectomy. Minimal symptoms at this time.   Plan   Laparoscopic incisional/ventral hernia repair. The timeframe is up to the patient and his family We will put in orders however the patient will call back to schedule the procedure.   Hatley Henegar O  08/16/2011, 9:05 AM  

## 2011-09-26 ENCOUNTER — Encounter (INDEPENDENT_AMBULATORY_CARE_PROVIDER_SITE_OTHER): Payer: Self-pay

## 2011-10-25 ENCOUNTER — Encounter (HOSPITAL_COMMUNITY): Payer: Self-pay | Admitting: Pharmacy Technician

## 2011-10-27 NOTE — Pre-Procedure Instructions (Addendum)
20 Samil Caggiano  10/27/2011   Your procedure is scheduled on:  Monday, October 28  Report to [facility] at 0800 AM.  Call this number if you have problems the morning of surgery: [phone]   Remember:   Do not eat food or drink liquids :After Midnight.  .   Take these medicines the morning of surgery with A SIP OF WATER: Levothyroxine, Claritin, xanax STOP flaxseed, multi vit, red yeast rice ext, aspirin mon 10/31/11   Do not wear jewelry, .  Do not wear lotions, powders,   Do not shave 48 hours prior to surgery. Men may shave face and neck.  Do not bring valuables to the hospital.  Contacts, dentures or bridgework may not be worn into surgery.  Leave suitcase in the car. After surgery it may be brought to your room.  For patients admitted to the hospital, checkout time is 11:00 AM the day of discharge.   Patients discharged the day of surgery will not be allowed to drive home.  Name and phone number of your driver:fran  Wife [phone]  Special Instructions: Shower using CHG 2 nights before surgery and the night before surgery.  If you shower the day of surgery use CHG.  Use special wash - you have one bottle of CHG for all showers.  You should use approximately 1/3 of the bottle for each shower.   Please read over the following fact sheets that you were given: Pain Booklet, Coughing and Deep Breathing, MRSA Information and Surgical Site Infection Prevention

## 2011-10-28 ENCOUNTER — Encounter (HOSPITAL_COMMUNITY)
Admission: RE | Admit: 2011-10-28 | Discharge: 2011-10-28 | Disposition: A | Discharge status: Home / Self Care | Payer: BC Managed Care – PPO | Source: Ambulatory Visit | Attending: General Surgery | Admitting: General Surgery

## 2011-10-28 ENCOUNTER — Encounter (HOSPITAL_COMMUNITY): Payer: Self-pay

## 2011-10-28 LAB — CBC WITH DIFFERENTIAL/PLATELET
Hemoglobin: 14.8 g/dL (ref 13.0–17.0)
Lymphocytes Relative: 34 % (ref 12–46)
Lymphs Abs: 1.8 10*3/uL (ref 0.7–4.0)
Monocytes Relative: 11 % (ref 3–12)
Neutro Abs: 2.6 10*3/uL (ref 1.7–7.7)
Neutrophils Relative %: 50 % (ref 43–77)
RBC: 5.12 MIL/uL (ref 4.22–5.81)

## 2011-10-28 LAB — BASIC METABOLIC PANEL
GFR calc Af Amer: 73 mL/min — ABNORMAL LOW (ref >90)
GFR calc non Af Amer: 63 mL/min — ABNORMAL LOW (ref >90)
Potassium: 4.7 mEq/L (ref 3.5–5.1)
Sodium: 140 mEq/L (ref 135–145)

## 2011-10-28 LAB — SURGICAL PCR SCREEN: MRSA, PCR: NEGATIVE

## 2011-10-28 MED ORDER — CHLORHEXIDINE GLUCONATE 4 % EX LIQD: 1.0000 "application " | Freq: Once | CUTANEOUS | Status: DC

## 2011-11-06 MED ORDER — CEFAZOLIN SODIUM-DEXTROSE 2-3 GM-% IV SOLR
2.0000 g | INTRAVENOUS | Status: AC
  Administered 2011-11-07: 2 g via INTRAVENOUS
  Filled 2011-11-06: qty 50

## 2011-11-07 ENCOUNTER — Encounter (HOSPITAL_COMMUNITY): Payer: Self-pay | Admitting: *Deleted

## 2011-11-07 ENCOUNTER — Encounter (HOSPITAL_COMMUNITY)
Admission: RE | Disposition: A | Discharge status: Home / Self Care | Payer: Self-pay | Source: Ambulatory Visit | Attending: General Surgery

## 2011-11-07 ENCOUNTER — Observation Stay (HOSPITAL_COMMUNITY)
Admission: RE | Admit: 2011-11-07 | Discharge: 2011-11-08 | Disposition: A | Discharge status: Home / Self Care | Payer: BC Managed Care – PPO | Source: Ambulatory Visit | Attending: General Surgery | Admitting: General Surgery

## 2011-11-07 ENCOUNTER — Ambulatory Visit (HOSPITAL_COMMUNITY): Payer: BC Managed Care – PPO | Admitting: *Deleted

## 2011-11-07 DIAGNOSIS — K439 Ventral hernia without obstruction or gangrene: Secondary | ICD-10-CM

## 2011-11-07 DIAGNOSIS — Z01810 Encounter for preprocedural cardiovascular examination: Secondary | ICD-10-CM | POA: Insufficient documentation

## 2011-11-07 DIAGNOSIS — K432 Incisional hernia without obstruction or gangrene: Principal | ICD-10-CM | POA: Insufficient documentation

## 2011-11-07 HISTORY — PX: VENTRAL HERNIA REPAIR: SHX424

## 2011-11-07 HISTORY — PX: INSERTION OF MESH: SHX5868

## 2011-11-07 SURGERY — REPAIR, HERNIA, VENTRAL, LAPAROSCOPIC
Anesthesia: Choice | Site: Abdomen | Wound class: Clean

## 2011-11-07 MED ORDER — OXYCODONE-ACETAMINOPHEN 5-325 MG PO TABS
1.0000 | ORAL_TABLET | ORAL | Status: DC | PRN
  Administered 2011-11-07 – 2011-11-08 (×2): 2 via ORAL
  Filled 2011-11-07 (×2): qty 2

## 2011-11-07 MED ORDER — ROCURONIUM BROMIDE 100 MG/10ML IV SOLN
INTRAVENOUS | Status: DC | PRN
  Administered 2011-11-07: 5 mg via INTRAVENOUS
  Administered 2011-11-07: 50 mg via INTRAVENOUS
  Administered 2011-11-07: 5 mg via INTRAVENOUS

## 2011-11-07 MED ORDER — HYDROMORPHONE HCL PF 1 MG/ML IJ SOLN: 1.0000 mg | INTRAMUSCULAR | Status: DC | PRN

## 2011-11-07 MED ORDER — BUPIVACAINE-EPINEPHRINE 0.25% -1:200000 IJ SOLN
INTRAMUSCULAR | Status: DC | PRN
  Administered 2011-11-07: 10 mL

## 2011-11-07 MED ORDER — CEFAZOLIN SODIUM 1-5 GM-% IV SOLN
1.0000 g | Freq: Three times a day (TID) | INTRAVENOUS | Status: AC
  Administered 2011-11-07: 1 g via INTRAVENOUS
  Filled 2011-11-07: qty 50

## 2011-11-07 MED ORDER — LEVOTHYROXINE SODIUM 50 MCG PO TABS: 50.0000 ug | ORAL_TABLET | Freq: Every day | ORAL | Status: DC

## 2011-11-07 MED ORDER — MEPERIDINE HCL 25 MG/ML IJ SOLN: 6.2500 mg | INTRAMUSCULAR | Status: DC | PRN

## 2011-11-07 MED ORDER — OXYCODONE HCL 5 MG PO TABS
5.0000 mg | ORAL_TABLET | Freq: Once | ORAL | Status: DC | PRN
Start: 2011-11-07 — End: 2011-11-07

## 2011-11-07 MED ORDER — FENTANYL CITRATE 0.05 MG/ML IJ SOLN
INTRAMUSCULAR | Status: DC | PRN
  Administered 2011-11-07 (×4): 50 ug via INTRAVENOUS
  Administered 2011-11-07: 100 ug via INTRAVENOUS
  Administered 2011-11-07 (×3): 50 ug via INTRAVENOUS

## 2011-11-07 MED ORDER — LACTATED RINGERS IV SOLN
INTRAVENOUS | Status: DC | PRN
  Administered 2011-11-07 (×2): via INTRAVENOUS

## 2011-11-07 MED ORDER — 0.9 % SODIUM CHLORIDE (POUR BTL) OPTIME
TOPICAL | Status: DC | PRN
  Administered 2011-11-07: 1000 mL

## 2011-11-07 MED ORDER — SODIUM CHLORIDE 0.9 % IR SOLN
Status: DC | PRN
  Administered 2011-11-07: 11:00:00

## 2011-11-07 MED ORDER — ONDANSETRON HCL 4 MG/2ML IJ SOLN: 4.0000 mg | Freq: Four times a day (QID) | INTRAMUSCULAR | Status: DC | PRN

## 2011-11-07 MED ORDER — ONDANSETRON HCL 4 MG PO TABS: 4.0000 mg | ORAL_TABLET | Freq: Four times a day (QID) | ORAL | Status: DC | PRN

## 2011-11-07 MED ORDER — MIDAZOLAM HCL 5 MG/5ML IJ SOLN
INTRAMUSCULAR | Status: DC | PRN
  Administered 2011-11-07: 2 mg via INTRAVENOUS

## 2011-11-07 MED ORDER — NEOSTIGMINE METHYLSULFATE 1 MG/ML IJ SOLN
INTRAMUSCULAR | Status: DC | PRN
  Administered 2011-11-07: 4 mg via INTRAVENOUS

## 2011-11-07 MED ORDER — HYDROMORPHONE HCL PF 1 MG/ML IJ SOLN
0.2500 mg | INTRAMUSCULAR | Status: DC | PRN
  Administered 2011-11-07 (×3): 0.5 mg via INTRAVENOUS

## 2011-11-07 MED ORDER — HYDROMORPHONE HCL PF 1 MG/ML IJ SOLN
INTRAMUSCULAR | Status: AC
  Filled 2011-11-07: qty 1

## 2011-11-07 MED ORDER — PROPOFOL 10 MG/ML IV BOLUS
INTRAVENOUS | Status: DC | PRN
  Administered 2011-11-07: 150 mg via INTRAVENOUS

## 2011-11-07 MED ORDER — BUPIVACAINE-EPINEPHRINE PF 0.25-1:200000 % IJ SOLN
INTRAMUSCULAR | Status: AC
  Filled 2011-11-07: qty 30

## 2011-11-07 MED ORDER — LEVOTHYROXINE SODIUM 50 MCG PO TABS
50.0000 ug | ORAL_TABLET | ORAL | Status: DC
  Administered 2011-11-08: 50 ug via ORAL
  Filled 2011-11-07: qty 1

## 2011-11-07 MED ORDER — PHENYLEPHRINE HCL 10 MG/ML IJ SOLN
10.0000 mg | INTRAMUSCULAR | Status: DC | PRN
  Administered 2011-11-07: 10 ug/min via INTRAVENOUS

## 2011-11-07 MED ORDER — ONDANSETRON HCL 4 MG/2ML IJ SOLN
INTRAMUSCULAR | Status: DC | PRN
  Administered 2011-11-07: 4 mg via INTRAVENOUS

## 2011-11-07 MED ORDER — ONDANSETRON HCL 4 MG/2ML IJ SOLN: 4.0000 mg | Freq: Once | INTRAMUSCULAR | Status: DC | PRN

## 2011-11-07 MED ORDER — ENOXAPARIN SODIUM 40 MG/0.4ML ~~LOC~~ SOLN
40.0000 mg | SUBCUTANEOUS | Status: DC
  Filled 2011-11-07: qty 0.4

## 2011-11-07 MED ORDER — HYDROMORPHONE HCL PF 1 MG/ML IJ SOLN
INTRAMUSCULAR | Status: AC
  Administered 2011-11-07: 0.5 mg
  Filled 2011-11-07: qty 1

## 2011-11-07 MED ORDER — KCL IN DEXTROSE-NACL 20-5-0.45 MEQ/L-%-% IV SOLN
INTRAVENOUS | Status: DC
  Administered 2011-11-07: 18:00:00 via INTRAVENOUS
  Filled 2011-11-07 (×3): qty 1000

## 2011-11-07 MED ORDER — GLYCOPYRROLATE 0.2 MG/ML IJ SOLN
INTRAMUSCULAR | Status: DC | PRN
  Administered 2011-11-07: .8 mg via INTRAVENOUS

## 2011-11-07 MED ORDER — LIDOCAINE HCL (CARDIAC) 20 MG/ML IV SOLN
INTRAVENOUS | Status: DC | PRN
  Administered 2011-11-07: 100 mg via INTRAVENOUS

## 2011-11-07 MED ORDER — ARTIFICIAL TEARS OP OINT
TOPICAL_OINTMENT | OPHTHALMIC | Status: DC | PRN
  Administered 2011-11-07: 1 via OPHTHALMIC

## 2011-11-07 MED ORDER — ALPRAZOLAM 0.25 MG PO TABS: 0.2500 mg | ORAL_TABLET | Freq: Every evening | ORAL | Status: DC | PRN

## 2011-11-07 MED ORDER — OXYCODONE HCL 5 MG/5ML PO SOLN: 5.0000 mg | Freq: Once | ORAL | Status: DC | PRN

## 2011-11-07 MED ORDER — LEVOTHYROXINE SODIUM 75 MCG PO TABS
75.0000 ug | ORAL_TABLET | ORAL | Status: DC
  Filled 2011-11-07: qty 1

## 2011-11-07 SURGICAL SUPPLY — 57 items
ADH SKN CLS APL DERMABOND .7 (GAUZE/BANDAGES/DRESSINGS) ×1
APL SKNCLS STERI-STRIP NONHPOA (GAUZE/BANDAGES/DRESSINGS) ×1
APPLIER CLIP LOGIC TI 5 (MISCELLANEOUS) IMPLANT
APPLIER CLIP ROT 10 11.4 M/L (STAPLE)
APR CLP MED LRG 11.4X10 (STAPLE)
APR CLP MED LRG 33X5 (MISCELLANEOUS)
BAG DECANTER FOR FLEXI CONT (MISCELLANEOUS) ×2 IMPLANT
BENZOIN TINCTURE PRP APPL 2/3 (GAUZE/BANDAGES/DRESSINGS) ×2 IMPLANT
BINDER ABD UNIV 12 45-62 (WOUND CARE) IMPLANT
BINDER ABDOMINAL 46IN 62IN (WOUND CARE) ×2
BLADE SURG ROTATE 9660 (MISCELLANEOUS) ×1 IMPLANT
CANISTER SUCTION 2500CC (MISCELLANEOUS) IMPLANT
CHLORAPREP W/TINT 26ML (MISCELLANEOUS) ×2 IMPLANT
CLIP APPLIE ROT 10 11.4 M/L (STAPLE) IMPLANT
CLOTH BEACON ORANGE TIMEOUT ST (SAFETY) ×2 IMPLANT
CLSR STERI-STRIP ANTIMIC 1/2X4 (GAUZE/BANDAGES/DRESSINGS) ×1 IMPLANT
DECANTER SPIKE VIAL GLASS SM (MISCELLANEOUS) ×2 IMPLANT
DERMABOND ADVANCED (GAUZE/BANDAGES/DRESSINGS) ×1
DERMABOND ADVANCED .7 DNX12 (GAUZE/BANDAGES/DRESSINGS) ×1 IMPLANT
DEVICE SECURE STRAP 25 ABSORB (INSTRUMENTS) ×3 IMPLANT
DEVICE TROCAR PUNCTURE CLOSURE (ENDOMECHANICALS) ×2 IMPLANT
DISSECTOR BLUNT TIP ENDO 5MM (MISCELLANEOUS) IMPLANT
DRAPE UTILITY 15X26 W/TAPE STR (DRAPE) ×4 IMPLANT
DRSG TEGADERM 4X4.75 (GAUZE/BANDAGES/DRESSINGS) ×1 IMPLANT
ELECT REM PT RETURN 9FT ADLT (ELECTROSURGICAL) ×2
ELECTRODE REM PT RTRN 9FT ADLT (ELECTROSURGICAL) ×1 IMPLANT
GLOVE BIO SURGEON STRL SZ7 (GLOVE) ×1 IMPLANT
GLOVE BIO SURGEON STRL SZ7.5 (GLOVE) ×1 IMPLANT
GLOVE BIOGEL PI IND STRL 7.5 (GLOVE) IMPLANT
GLOVE BIOGEL PI IND STRL 8 (GLOVE) ×1 IMPLANT
GLOVE BIOGEL PI INDICATOR 7.5 (GLOVE) ×4
GLOVE BIOGEL PI INDICATOR 8 (GLOVE) ×1
GLOVE ECLIPSE 7.5 STRL STRAW (GLOVE) ×4 IMPLANT
GOWN STRL NON-REIN LRG LVL3 (GOWN DISPOSABLE) ×4 IMPLANT
KIT BASIN OR (CUSTOM PROCEDURE TRAY) ×2 IMPLANT
KIT ROOM TURNOVER OR (KITS) ×2 IMPLANT
MESH PHYSIO OVAL 15X20CM (Mesh General) ×1 IMPLANT
NDL SPNL 22GX3.5 QUINCKE BK (NEEDLE) ×1 IMPLANT
NEEDLE SPNL 22GX3.5 QUINCKE BK (NEEDLE) ×2 IMPLANT
NS IRRIG 1000ML POUR BTL (IV SOLUTION) ×2 IMPLANT
PAD ARMBOARD 7.5X6 YLW CONV (MISCELLANEOUS) ×4 IMPLANT
PEN SKIN MARKING BROAD (MISCELLANEOUS) ×2 IMPLANT
SET IRRIG TUBING LAPAROSCOPIC (IRRIGATION / IRRIGATOR) IMPLANT
SLEEVE ENDOPATH XCEL 5M (ENDOMECHANICALS) ×1 IMPLANT
STAPLER VISISTAT 35W (STAPLE) ×2 IMPLANT
STRIP CLOSURE SKIN 1/2X4 (GAUZE/BANDAGES/DRESSINGS) ×1 IMPLANT
SUT MNCRL AB 4-0 PS2 18 (SUTURE) ×2 IMPLANT
SUT NOVA 0 T19/GS 22DT (SUTURE) ×4 IMPLANT
SUT VICRYL 0 TIES 12 18 (SUTURE) IMPLANT
SUT VICRYL 0 UR6 27IN ABS (SUTURE) IMPLANT
TOWEL OR 17X24 6PK STRL BLUE (TOWEL DISPOSABLE) ×2 IMPLANT
TOWEL OR 17X26 10 PK STRL BLUE (TOWEL DISPOSABLE) ×2 IMPLANT
TRAY FOLEY CATH 14FRSI W/METER (CATHETERS) ×2 IMPLANT
TRAY LAPAROSCOPIC (CUSTOM PROCEDURE TRAY) ×2 IMPLANT
TROCAR XCEL NON-BLD 11X100MML (ENDOMECHANICALS) ×2 IMPLANT
TROCAR XCEL NON-BLD 5MMX100MML (ENDOMECHANICALS) ×3 IMPLANT
WATER STERILE IRR 1000ML POUR (IV SOLUTION) ×1 IMPLANT

## 2011-11-07 NOTE — Transfer of Care (Signed)
Immediate Anesthesia Transfer of Care Note  Patient: Eric Cordova  Procedure(s) Performed: Procedure(s) (LRB) with comments: LAPAROSCOPIC VENTRAL HERNIA (N/A) INSERTION OF MESH (N/A)  Patient Location: PACU  Anesthesia Type:GETA Level of Consciousness: awake, alert  and oriented  Airway & Oxygen Therapy: Patient Spontanous Breathing and Patient connected to face mask oxygen  Post-op Assessment: Report given to PACU RN and Post -op Vital signs reviewed and stable  Post vital signs: Reviewed and stable  Complications: No apparent anesthesia complications

## 2011-11-07 NOTE — H&P (Signed)
Patient ID: Eric Cordova, male DOB: [birth year], 61 y.o. MRN: [id number]  Chief Complaint   Patient presents with   .  Incisional Hernia    HPI  Eric Cordova is a 61 y.o. male. The patient comes in with an abdominal wall mass. He believes that it is a ventral hernia. He had some imaging done at an outside facility. The results are pending.  HPI  Past Medical History   Diagnosis  Date   .  Hypothyroidism    .  Diverticulitis    .  Degenerative joint disease    .  Asthma    .  Allergy    .  Constipation    .  Pneumonia    .  Hyperlipidemia     Past Surgical History   Procedure  Date   .  Tonsillectomy  1958   .  Hernia repair  1960, 1974     inguinal bilateral   .  Pilonidal cyst excision  1973   .  Nasal septum surgery  1982   .  Hydrocele excision  1984    Family History   Problem  Relation  Age of Onset   .  Cancer  Mother       breast    .  Cancer  Father       prostate    .  Cancer  Maternal Uncle       prostate    Social History  History   Substance Use Topics   .  Smoking status:  Never Smoker   .  Smokeless tobacco:  Never Used   .  Alcohol Use:  Yes    No Known Allergies  Current Outpatient Prescriptions   Medication  Sig  Dispense  Refill   .  ALPRAZolam (XANAX) 0.5 MG tablet  Take 0.25 mg by mouth at bedtime as needed. For sleep/anxiety     .  aspirin 325 MG tablet  Take 325 mg by mouth daily.     .  Calcium Carbonate-Vit D-Min (CALCIUM 1200 PO)  Take 1 tablet by mouth 2 (two) times daily.     .  Cholecalciferol (VITAMIN D-3 PO)  Take 2,000 mg by mouth daily.     .  fenofibrate micronized (LOFIBRA) 134 MG capsule  Take 134 mg by mouth daily before breakfast.     .  fish oil-omega-3 fatty acids 1000 MG capsule  Take 1 g by mouth 3 (three) times daily.     .  Flaxseed, Linseed, 1000 MG CAPS  Take 1 capsule by mouth 2 (two) times daily.     .  fluocinolone (SYNALAR) 0.01 % external solution  daily.     Marland Kitchen  levothyroxine (SYNTHROID,  LEVOTHROID) 50 MCG tablet  Take 50-75 mcg by mouth daily. 1.5 tablet on Monday, Wednesday and Friday; 1 tablet all other days of the week     .  loratadine (CLARITIN) 10 MG tablet  Take 10 mg by mouth daily.     .  Multiple Vitamin (MULTIVITAMIN) capsule  Take 1 capsule by mouth daily.  30 capsule  0   .  Pediatric Multivit-Minerals-C (GUMMI BEAR MULTIVITAMIN/MIN) CHEW  Chew 2 tablets by mouth daily.     .  Red Yeast Rice Extract (RED YEAST RICE PO)  Take 1 tablet by mouth 2 (two) times daily.     .  Testosterone (ANDROGEL TD)  Place 1.62 mg onto the skin daily.     .  vitamin C (  ASCORBIC ACID) 500 MG tablet  Take 500 mg by mouth 2 (two) times daily.      Review of Systems  Review of Systems  Constitutional: Negative.  HENT: Negative.  Eyes: Negative.  Respiratory: Negative.  Cardiovascular: Negative.  Gastrointestinal: Positive for abdominal distention.  Genitourinary: Negative.  Musculoskeletal: Negative.  Neurological: Negative.  Hematological: Negative.  Psychiatric/Behavioral: Negative.   Blood pressure 132/86, pulse 72, temperature 97.3 F (36.3 C), temperature source Temporal, resp. rate 16, height 5\' 7"  (1.702 m), weight 217 lb 8 oz (98.657 kg).  Physical Exam  Physical Exam  Constitutional: He is oriented to person, place, and time. He appears well-developed and well-nourished.  HENT:  Head: Normocephalic and atraumatic.  Eyes: Conjunctivae and EOM are normal. Pupils are equal, round, and reactive to light.  Neck: Normal range of motion. Neck supple.  Cardiovascular: Normal rate, regular rhythm and normal heart sounds.  Pulmonary/Chest: Effort normal and breath sounds normal.  Abdominal: Soft. Bowel sounds are normal.    Musculoskeletal: Normal range of motion.  Neurological: He is alert and oriented to person, place, and time.  Skin: Skin is warm.  Psychiatric: He has a normal mood and affect. His behavior is normal. Judgment and thought content normal.   Data  Reviewed  I have not received the results from his CT scan done recently in July however by report suggests a possible incisional hernia.  Assessment   Incisional/ventral hernia status post sigmoid colectomy. Minimal symptoms at this time.   Plan   Laparoscopic incisional/ventral hernia repair. The timeframe is up to the patient and his family We will put in orders however the patient will call back to schedule the procedure.   Cherylynn Ridges  08/16/2011, 9:05 AM

## 2011-11-07 NOTE — Op Note (Signed)
OPERATIVE REPORT  DATE OF OPERATION: 11/07/2011  PATIENT:  Eric Cordova  61 y.o. male  PRE-OPERATIVE DIAGNOSIS:   ventral hernia/incisional hernia    POST-OPERATIVE DIAGNOSIS:   ventral hernia/incisional hernia  PROCEDURE:  Procedure(s): LAPAROSCOPIC VENTRAL HERNIA INSERTION OF MESH  SURGEON:  Surgeon(s): Cherylynn Ridges, MD Wilmon Arms. Tsuei, MD  ASSISTANT: Tsuei  ANESTHESIA:   general  EBL: <50 ml  BLOOD ADMINISTERED: none  DRAINS: none   SPECIMEN:  No Specimen  COUNTS CORRECT:  YES  PROCEDURE DETAILS: The patient was taken to the operating room and placed on the table in supine position. After an adequate general endotracheal anesthetic was administered he was prepped and draped in usual sterile manner exposing the entire abdomen.  After proper time out was performed identifying the patient and the procedure to be performed a right upper quadrant transverse incision was made allowing the use of an Optiview trocar and cannula to be passed into the peritoneal cavity with the inserted the laparoscope with attached camera and light source. We entered the peritoneal cavity safely. We subsequently insufflated carbon dioxide gas through the trocar up to a maximal intra-abdominal pressure 15mm of mercury. We subsequently placed a right lower quadrant 5 mm cannula and the left lower quadrant 5 mm cannula under direct vision.  There will omental adhesions into the hernia sac on the anterior abdominal wall which were directly visualized. Using a laparoscopic [place] dissector and a grasper we dissected out the omentum from the hernia sac. This left a large defect actually 2 defects which were side by side measuring approximately 10 x 13 cm in size. We used a piece of mesh measuring 20 x 15 cm in size in order to repair the hernia.  The mesh was soaked in antibiotic solution. Interrupted sutures of #1 Novafil were placed in 8 equally space places along the edge of the mesh. We  then inserted the rolled up mesh into the peritoneal cavity through the 10 mm cannula. We oriented the mesh in a proper position then used a suture retriever in order to retrieve the 8 equally spaced the sutures on the mesh to the anterior abdominal wall. These were tied down through the small incisions where the sutures were retrieved. This brought up the mesh to the anterior abdominal wall covering the hernia defect. We then used a SecureStrap tacker in order to tack down the mesh circumferentially around the hernia defect. Once this was done the mesh was in appropriate position. We allowed the gas to escape from the peritoneal cavity as the mesh came down.  All cannulas were removed. We reapproximated the skin at the 10 mm trocar site using a running subcuticular suture for Monocryl. Dermabond and Steri-Strips and Tegaderms use complete the dressings all other sites. All counts were correct.  PATIENT DISPOSITION:  PACU - hemodynamically stable.   Cherylynn Ridges 10/28/201312:07 PM

## 2011-11-07 NOTE — Anesthesia Postprocedure Evaluation (Signed)
Anesthesia Post Note  Patient: Eric Cordova  Procedure(s) Performed: Procedure(s) (LRB): LAPAROSCOPIC VENTRAL HERNIA (N/A) INSERTION OF MESH (N/A)  Anesthesia type: general  Patient location: PACU  Post pain: Pain level controlled  Post assessment: Patient's Cardiovascular Status Stable  Last Vitals:  Filed Vitals:   11/07/11 1324  BP: 146/79  Pulse:   Temp:   Resp:     Post vital signs: Reviewed and stable  Level of consciousness: sedated  Complications: No apparent anesthesia complications

## 2011-11-07 NOTE — Progress Notes (Signed)
Patient admitted to [facility] room 5. S/P lap ventral hernia repair with mesh. Multiple small incision sites with steristrips intact. Abdominal binder on. Awake and oriented. Wife at bedside. Call bell in reach.

## 2011-11-07 NOTE — Anesthesia Preprocedure Evaluation (Addendum)
Anesthesia Evaluation  Patient identified by MRN, date of birth, ID band Patient awake    Reviewed: Allergy & Precautions, H&P , NPO status , Patient's Chart, lab work & pertinent test results  History of Anesthesia Complications Negative for: history of anesthetic complications  Airway Mallampati: II TM Distance: >3 FB Neck ROM: Limited    Dental  (+) Dental Advisory Given   Pulmonary asthma ,          Cardiovascular negative cardio ROS      Neuro/Psych Anxiety    GI/Hepatic negative GI ROS, Neg liver ROS,   Endo/Other    Renal/GU negative Renal ROS  negative genitourinary   Musculoskeletal negative musculoskeletal ROS (+)   Abdominal   Peds  Hematology negative hematology ROS (+)   Anesthesia Other Findings   Reproductive/Obstetrics                          Anesthesia Physical Anesthesia Plan  ASA: III  Anesthesia Plan: General   Post-op Pain Management:    Induction: Intravenous  Airway Management Planned: Oral ETT  Additional Equipment:   Intra-op Plan:   Post-operative Plan: Extubation in OR  Informed Consent: I have reviewed the patients History and Physical, chart, labs and discussed the procedure including the risks, benefits and alternatives for the proposed anesthesia with the patient or authorized representative who has indicated his/her understanding and acceptance.   Dental advisory given  Plan Discussed with: CRNA and Surgeon  Anesthesia Plan Comments:        Anesthesia Quick Evaluation

## 2011-11-07 NOTE — Preoperative (Signed)
Beta Blockers   Reason not to administer Beta Blockers:Not Applicable 

## 2011-11-08 ENCOUNTER — Telehealth (INDEPENDENT_AMBULATORY_CARE_PROVIDER_SITE_OTHER): Payer: Self-pay

## 2011-11-08 MED ORDER — OXYCODONE-ACETAMINOPHEN 5-325 MG PO TABS: 1.0000 | ORAL_TABLET | ORAL | Status: DC | PRN

## 2011-11-08 NOTE — Discharge Summary (Signed)
Physician Discharge Summary  Patient ID: Eric Cordova MRN: [id number] DOB/AGE: 1950-08-18 61 y.o.  Admit date: 11/07/2011 Discharge date: 11/08/2011  Admission Diagnoses:  Discharge Diagnoses:  Active Problems:  * No active hospital problems. *    Discharged Condition: good  Hospital Course: The patient was admitted overnight for observation after a laparoscopic ventral/incisional hernia repair.  He did well overnight.  He was able to ambulate in the hallways overnight. He is tolerating a regular diet.  Consults: None  Significant Diagnostic Studies: None  Treatments: IV hydration, antibiotics: Ancef and analgesia: Dilaudid and Percocet  Discharge Exam: Blood pressure 127/53, pulse 64, temperature 98.2 F (36.8 C), temperature source Oral, resp. rate 18, SpO2 94.00%. General appearance: alert, cooperative and no distress Resp: clear to auscultation bilaterally GI: soft, non-tender; bowel sounds normal; no masses,  no organomegaly Extremities: Homans sign is negative, no sign of DVT and no edema, redness or tenderness in the calves or thighs  Disposition: 01-Home or Self Care  Discharge Orders    Future Orders Please Complete By Expires   Diet - low sodium heart healthy      Increase activity slowly      Lifting restrictions      Comments:   No lifting > 20 pounds for 6 weeks   Driving Restrictions      Comments:   May drive when not taking pain medications.   Discharge instructions      Comments:   Pain medication will tend to constipate you.  Take stool softener, lots of fluids, and perhaps Miralax twice daily as needed.   Leave dressing on - Keep it clean, dry, and intact until clinic visit      Comments:   May shower with the binder off, pat the wounds dry.   Call MD for:  extreme fatigue      Call MD for:  persistant dizziness or light-headedness      Call MD for:  hives      Call MD for:  difficulty breathing, headache or visual disturbances      Call  MD for:  redness, tenderness, or signs of infection (pain, swelling, redness, odor or green/yellow discharge around incision site)      Call MD for:  severe uncontrolled pain      Call MD for:  persistant nausea and vomiting      Call MD for:  temperature >100.4          Medication List     As of 11/08/2011  8:03 AM    TAKE these medications         ALPRAZolam 0.5 MG tablet   Commonly known as: XANAX   Take 0.25 mg by mouth at bedtime as needed. For sleep/anxiety      ANDROGEL TD   Place 1.62 mg onto the skin daily.      aspirin 325 MG tablet   Take 325 mg by mouth daily.      CALCIUM 1200 PO   Take 1 tablet by mouth 2 (two) times daily.      fenofibrate micronized 134 MG capsule   Commonly known as: LOFIBRA   Take 134 mg by mouth daily before breakfast.      Flaxseed (Linseed) 1000 MG Caps   Take 1 capsule by mouth 2 (two) times daily.      fluocinolone 0.01 % external solution   Commonly known as: SYNALAR   daily.      Gummi Bear Multivitamin/Min [employer]  Chew 2 tablets by mouth daily.      levothyroxine 50 MCG tablet   Commonly known as: SYNTHROID, LEVOTHROID   Take 50-75 mcg by mouth daily. 1.5 tablet on Monday, Wednesday and Friday; 1 tablet all other days of the week      loratadine 10 MG tablet   Commonly known as: CLARITIN   Take 10 mg by mouth daily.      multivitamin capsule   Take 1 capsule by mouth daily.      oxyCODONE-acetaminophen 5-325 MG per tablet   Commonly known as: PERCOCET/ROXICET   Take 1-2 tablets by mouth every 4 (four) hours as needed.      RED YEAST RICE PO   Take 1 tablet by mouth 2 (two) times daily.      vitamin C 500 MG tablet   Commonly known as: ASCORBIC ACID   Take 500 mg by mouth 2 (two) times daily.      VITAMIN D-3 PO   Take 2,000 mg by mouth daily.           Follow-up Information    Follow up with Rocio Wolak, Marta Lamas, MD. In 2 weeks.   Contact information:   [address] [place],  [place] [address] [phone]          Signed: Cherylynn Ridges 11/08/2011, 8:03 AM

## 2011-11-08 NOTE — Telephone Encounter (Signed)
Calld patient and left post op appointment information on machine

## 2011-11-08 NOTE — Progress Notes (Signed)
Discharge home.

## 2011-11-08 NOTE — Progress Notes (Signed)
GS Progress Note Subjective: The patient looks great.  Minimal abdominal pain and discomfort.  Able to ambulate in the hallways.  Objective: Vital signs in last 24 hours: Temp:  [97.2 F (36.2 C)-98.2 F (36.8 C)] 98.2 F (36.8 C) (10/29 0620) Pulse Rate:  [42-86] 64  (10/29 0620) Resp:  [12-18] 18  (10/29 0620) BP: (123-154)/(53-84) 127/53 mmHg (10/29 0620) SpO2:  [94 %-100 %] 94 % (10/29 0620) Last BM Date: 11/06/11  Intake/Output from previous day: 10/28 0701 - 10/29 0700 In: 2273.8 [P.O.:50; I.V.:2223.8] Out: 445 [Urine:420; Blood:25] Intake/Output this shift:    Lungs: Clear to auscultation.  Abd: Soft, good bowel sounds.  No evidence or hernia with coughing or Valsalva  Extremities: No clinical signs or symptoms of DVT  Neuro: Intact  Lab Results: CBC  No results found for this basename: WBC:2,HGB:2,HCT:2,PLT:2 in the last 72 hours BMET No results found for this basename: NA:2,K:2,CL:2,CO2:2,GLUCOSE:2,BUN:2,CREATININE:2,CALCIUM:2 in the last 72 hours PT/INR No results found for this basename: LABPROT:2,INR:2 in the last 72 hours ABG No results found for this basename: PHART:2,PCO2:2,PO2:2,HCO3:2 in the last 72 hours  Studies/Results: No results found.  Anti-infectives: Anti-infectives     Start     Dose/Rate Route Frequency Ordered Stop   11/07/11 1800   ceFAZolin (ANCEF) IVPB 1 g/50 mL premix        1 g 100 mL/hr over 30 Minutes Intravenous 3 times per day 11/07/11 1507 11/07/11 1857   11/07/11 1100   polymyxin B 500,000 Units, bacitracin 50,000 Units in sodium chloride irrigation 0.9 % 500 mL irrigation  Status:  Discontinued          As needed 11/07/11 1100 11/07/11 1212   11/06/11 1454   ceFAZolin (ANCEF) IVPB 2 g/50 mL premix        2 g 100 mL/hr over 30 Minutes Intravenous 60 min pre-op 11/06/11 1454 11/07/11 1052          Assessment/Plan: s/p Procedure(s): LAPAROSCOPIC VENTRAL HERNIA INSERTION OF MESH Advance diet Discharge Follow up  to see me in two weeks.  LOS: 1 day    Marta Lamas. Gae Bon, MD, FACS [phone] [facility] 11/08/2011

## 2011-11-10 ENCOUNTER — Encounter (HOSPITAL_COMMUNITY): Payer: Self-pay | Admitting: General Surgery

## 2011-11-23 ENCOUNTER — Encounter (INDEPENDENT_AMBULATORY_CARE_PROVIDER_SITE_OTHER): Payer: Self-pay | Admitting: General Surgery

## 2011-11-23 ENCOUNTER — Ambulatory Visit (INDEPENDENT_AMBULATORY_CARE_PROVIDER_SITE_OTHER): Payer: BC Managed Care – PPO | Admitting: General Surgery

## 2011-11-23 VITALS — BP 122/84 | HR 72 | Temp 98.6°F | Resp 16 | Ht 67.0 in | Wt 219.8 lb

## 2011-11-23 DIAGNOSIS — Z09 Encounter for follow-up examination after completed treatment for conditions other than malignant neoplasm: Secondary | ICD-10-CM

## 2011-11-23 MED ORDER — OXYCODONE-ACETAMINOPHEN 5-325 MG PO TABS: 1.0000 | ORAL_TABLET | ORAL | Status: DC | PRN

## 2011-11-23 NOTE — Progress Notes (Signed)
The patient is doing well status post laparoscopic ventral hernia repair. On examination there is no evidence recurrence. I removed all the plastic dressing with a Tegaderm from the wounds. The wounds look well with no evidence of infection.  I asked the patient to stand and Valsalva and cough there was no evidence recurrence. He has a binder in place. He is returned to see me in one month for his last recheck. I will  prescribe him some Percocet tablets #25.

## 2011-11-25 ENCOUNTER — Encounter (INDEPENDENT_AMBULATORY_CARE_PROVIDER_SITE_OTHER): Payer: BC Managed Care – PPO | Admitting: Surgery

## 2011-12-20 ENCOUNTER — Ambulatory Visit (INDEPENDENT_AMBULATORY_CARE_PROVIDER_SITE_OTHER): Payer: BC Managed Care – PPO | Admitting: General Surgery

## 2011-12-20 ENCOUNTER — Encounter (INDEPENDENT_AMBULATORY_CARE_PROVIDER_SITE_OTHER): Payer: Self-pay | Admitting: General Surgery

## 2011-12-20 VITALS — BP 124/82 | HR 76 | Temp 98.3°F | Resp 14 | Ht 67.0 in | Wt 221.8 lb

## 2011-12-20 DIAGNOSIS — Z09 Encounter for follow-up examination after completed treatment for conditions other than malignant neoplasm: Secondary | ICD-10-CM

## 2011-12-20 NOTE — Progress Notes (Signed)
The patient is doing well status post laparoscopic ventral hernia repair with mesh. He continues to wear his binder but only needs to do so for another week.  The patient has some mild discomfort in the left upper portion of his abdomen but this is not very bad. This only area where he is having pain and discomfort at this time.  He asked about exercising and I told him not to attempt any type of abdominal crunches for the 6 months. He can exercise otherwise as tolerated starting next week. Will be his last clinic appointment unless he needs to see me.

## 2012-11-22 ENCOUNTER — Ambulatory Visit: Payer: Self-pay | Admitting: Physician Assistant

## 2012-12-01 ENCOUNTER — Encounter: Payer: Self-pay | Admitting: Internal Medicine

## 2012-12-01 DIAGNOSIS — R7303 Prediabetes: Secondary | ICD-10-CM | POA: Insufficient documentation

## 2012-12-01 DIAGNOSIS — I10 Essential (primary) hypertension: Secondary | ICD-10-CM | POA: Insufficient documentation

## 2012-12-01 DIAGNOSIS — T7840XA Allergy, unspecified, initial encounter: Secondary | ICD-10-CM | POA: Insufficient documentation

## 2012-12-01 DIAGNOSIS — E785 Hyperlipidemia, unspecified: Secondary | ICD-10-CM | POA: Insufficient documentation

## 2012-12-01 DIAGNOSIS — E039 Hypothyroidism, unspecified: Secondary | ICD-10-CM | POA: Insufficient documentation

## 2012-12-01 DIAGNOSIS — M1990 Unspecified osteoarthritis, unspecified site: Secondary | ICD-10-CM | POA: Insufficient documentation

## 2012-12-03 ENCOUNTER — Ambulatory Visit: Payer: Self-pay | Admitting: Physician Assistant

## 2012-12-03 ENCOUNTER — Encounter: Payer: Self-pay | Admitting: Physician Assistant

## 2012-12-03 VITALS — BP 132/78 | HR 60 | Temp 98.3°F | Resp 16 | Ht 67.0 in | Wt 207.0 lb

## 2012-12-03 DIAGNOSIS — E559 Vitamin D deficiency, unspecified: Secondary | ICD-10-CM

## 2012-12-03 DIAGNOSIS — E785 Hyperlipidemia, unspecified: Secondary | ICD-10-CM

## 2012-12-03 DIAGNOSIS — E782 Mixed hyperlipidemia: Secondary | ICD-10-CM

## 2012-12-03 DIAGNOSIS — I10 Essential (primary) hypertension: Secondary | ICD-10-CM

## 2012-12-03 DIAGNOSIS — E039 Hypothyroidism, unspecified: Secondary | ICD-10-CM

## 2012-12-03 DIAGNOSIS — R7303 Prediabetes: Secondary | ICD-10-CM

## 2012-12-03 LAB — HEPATIC FUNCTION PANEL
ALT: 18 U/L (ref 0–53)
AST: 19 U/L (ref 0–37)
Albumin: 4.5 g/dL (ref 3.5–5.2)
Alkaline Phosphatase: 57 U/L (ref 39–117)
Total Protein: 7.5 g/dL (ref 6.0–8.3)

## 2012-12-03 LAB — BASIC METABOLIC PANEL WITH GFR
BUN: 14 mg/dL (ref 6–23)
CO2: 25 mEq/L (ref 19–32)
Calcium: 10.6 mg/dL — ABNORMAL HIGH (ref 8.4–10.5)
Chloride: 103 mEq/L (ref 96–112)
Creat: 1.07 mg/dL (ref 0.50–1.35)
Glucose, Bld: 116 mg/dL — ABNORMAL HIGH (ref 70–99)

## 2012-12-03 LAB — LIPID PANEL
Cholesterol: 281 mg/dL — ABNORMAL HIGH (ref 0–200)
Total CHOL/HDL Ratio: 3.7 Ratio

## 2012-12-03 MED ORDER — TRAZODONE HCL 150 MG PO TABS: 150.0000 mg | ORAL_TABLET | Freq: Every day | ORAL | Status: DC

## 2012-12-03 MED ORDER — FLUOCINOLONE ACETONIDE 0.01 % EX SOLN: Freq: Two times a day (BID) | CUTANEOUS | Status: DC

## 2012-12-03 NOTE — Progress Notes (Signed)
HPI Patient presents for 3 month follow up with hypertension, hyperlipidemia, prediabetes and vitamin D. Patient's blood pressure has been controlled at home. Patient denies chest pain, shortness of breath, dizziness.  Patient's cholesterol is diet controlled. In addition he recently started on lopid and denies myalgias. The patient has been working on diet and exercise for prediabetes, and denies changes in vision, polys, and paresthesias.   Patient has not slept well last few nights- take 1/2 xanax at night but it is not helping Has new job where he is working 6 days a week and working early AM to 10-11 at night. Very stressful.  Patient is on Vitamin D supplement.  Current Medications:  Current Outpatient Prescriptions on File Prior to Visit  Medication Sig Dispense Refill  . ALPRAZolam (XANAX) 0.5 MG tablet Take 0.25 mg by mouth at bedtime as needed. For sleep/anxiety      . aspirin 325 MG tablet Take 325 mg by mouth daily.       . Calcium Carbonate-Vit D-Min (CALCIUM 1200 PO) Take 1 tablet by mouth 2 (two) times daily.       . Cholecalciferol (VITAMIN D-3 PO) Take 2,000 mg by mouth daily.        Marland Kitchen levothyroxine (SYNTHROID, LEVOTHROID) 50 MCG tablet Take 50-75 mcg by mouth daily. 1.5 tablet on Monday, Wednesday and Friday; 1 tablet all other days of the week        No current facility-administered medications on file prior to visit.   Medical History:  Past Medical History  Diagnosis Date  . Diverticulitis   . Constipation   . Pneumonia   . Incisional hernia   . Hyperlipidemia   . Hypothyroidism   . Allergy   . Degenerative joint disease   . Asthma   . Hypertension   . Prediabetes    Allergies: No Known Allergies  ROS Constitutional: insomnia, Denies fever, chills, weight loss/gain, headaches,  fatigue, night sweats, and change in appetite. Eyes: Denies redness, blurred vision, diplopia, discharge, itchy, watery eyes.  ENT: Denies discharge, congestion, post nasal drip, sore  throat, earache, dental pain, Tinnitus, Vertigo, Sinus pain, snoring.  Cardio: Denies chest pain, palpitations, irregular heartbeat,  dyspnea, diaphoresis, orthopnea, PND, claudication, edema Respiratory: denies cough, dyspnea,pleurisy, hoarseness, wheezing.  Gastrointestinal: Denies dysphagia, heartburn,  water brash, pain, cramps, nausea, vomiting, bloating, diarrhea, constipation, hematemesis, melena, hematochezia,  hemorrhoids Genitourinary: Denies dysuria, frequency, urgency, nocturia, hesitancy, discharge, hematuria, flank pain Musculoskeletal: Denies arthralgia, myalgia, stiffness, Jt. Swelling, pain, limp, and strain/sprain. Skin: Denies pruritis, rash, hives, warts, acne, eczema, changing in skin lesion Neuro: denies Weakness, tremor, incoordination, spasms, paresthesia, pain Psychiatric: Denies confusion, memory loss, sensory loss Endocrine: Denies change in weight, skin, hair change, nocturia, and paresthesia, Diabetic Polys, visual blurring, hyper /hypo glycemic episodes.  Heme/Lymph: Denies Excessive bleeding, bruising, enlarged lymph nodes  Family history- Review and unchanged Social history- Review and unchanged Physical Exam: Filed Vitals:   12/03/12 0854  BP: 132/78  Pulse: 60  Temp: 98.3 F (36.8 C)  Resp: 16   Filed Weights   12/03/12 0854  Weight: 207 lb (93.895 kg)   General Appearance: Well nourished, in no apparent distress. Eyes: PERRLA, EOMs, conjunctiva no swelling or erythema, normal fundi and vessels. Sinuses: No Frontal/maxillary tenderness ENT/Mouth: Ext aud canals clear, with TMs without erythema, bulging.No erythema, swelling, or exudate on post pharynx.  Tonsils not swollen or erythematous. Hearing normal.  Neck: Supple, thyroid normal.  Respiratory: Respiratory effort normal, BS equal bilaterally without rales, rhonchi, wheezing  or stridor.  Cardio: Heart sounds normal, regular rate and rhythm without murmurs, rubs or gallops. Peripheral pulses brisk  and equal bilaterally, without edema.  Abdomen: Flat, soft, with bowel sounds. Non tender, no guarding, rebound, hernias, masses, or organomegaly.  Lymphatics: Non tender without lymphadenopathy.  Musculoskeletal: Full ROM all peripheral extremities, joint stability, 5/5 strength, and normal gait. Skin: Warm, dry without rashes, lesions, ecchymosis.  Neuro: Cranial nerves intact, reflexes equal bilaterally. Normal muscle tone, no cerebellar symptoms. Sensation intact.  Psych: Awake and oriented X 3, normal affect, Insight and Judgment appropriate.   Assessment and Plan:  Hypertension: Continue medication, monitor blood pressure at home. Continue DASH diet. Cholesterol: Continue diet and exercise. Check cholesterol and LFTs since recently started lopid Pre-diabetes-Continue diet and exercise.  Vitamin D Def- check level and continue medications.  Insomnia- try trazadone 150mg  #30 NR.   Quentin Mulling 9:10 AM

## 2012-12-04 ENCOUNTER — Other Ambulatory Visit: Payer: Self-pay | Admitting: Physician Assistant

## 2012-12-04 MED ORDER — TRIAMCINOLONE 0.1 % CREAM:EUCERIN CREAM 1:1: 1.0000 "application " | TOPICAL_CREAM | Freq: Two times a day (BID) | CUTANEOUS | Status: DC | PRN

## 2012-12-05 ENCOUNTER — Encounter: Payer: Self-pay | Admitting: Internal Medicine

## 2012-12-05 ENCOUNTER — Telehealth: Payer: Self-pay | Admitting: Internal Medicine

## 2012-12-05 MED ORDER — TRIAMCINOLONE 0.1 % CREAM:EUCERIN CREAM 1:1: 1.0000 "application " | TOPICAL_CREAM | Freq: Two times a day (BID) | CUTANEOUS | Status: DC | PRN

## 2012-12-05 NOTE — Telephone Encounter (Signed)
Pt wife said that the pharmacy at [facility] needs the ingredients for the cream you rx him.   Pharm:[phone]

## 2012-12-14 ENCOUNTER — Other Ambulatory Visit: Payer: Self-pay | Admitting: Physician Assistant

## 2012-12-14 ENCOUNTER — Telehealth: Payer: Self-pay | Admitting: *Deleted

## 2012-12-14 MED ORDER — CLONAZEPAM 1 MG PO TABS: 1.0000 mg | ORAL_TABLET | Freq: Every day | ORAL | Status: DC

## 2012-12-14 NOTE — Telephone Encounter (Signed)
New sleep medication not working not abel to sleep & makes pt feel terrible also. Has tried generic Klonpin before & just wants to know can he get a rx of this medication. If so pts wife will pick up rx because he is self pay to take to the less expensive pharm .

## 2012-12-14 NOTE — Telephone Encounter (Signed)
Patient aware RX was printed and at front desk, also advised he can check the price at [facility] because before printed it was sent there

## 2013-02-04 ENCOUNTER — Other Ambulatory Visit: Payer: Self-pay | Admitting: Physician Assistant

## 2013-02-04 MED ORDER — CLONAZEPAM 1 MG PO TABS: 1.0000 mg | ORAL_TABLET | Freq: Every day | ORAL | Status: DC

## 2013-02-14 ENCOUNTER — Ambulatory Visit: Payer: Self-pay | Admitting: Emergency Medicine

## 2013-02-14 ENCOUNTER — Encounter: Payer: Self-pay | Admitting: Emergency Medicine

## 2013-04-19 ENCOUNTER — Other Ambulatory Visit: Payer: Self-pay | Admitting: Physician Assistant

## 2013-06-18 ENCOUNTER — Emergency Department (HOSPITAL_COMMUNITY): Payer: No Typology Code available for payment source

## 2013-06-18 ENCOUNTER — Emergency Department (HOSPITAL_COMMUNITY)
Admission: EM | Admit: 2013-06-18 | Discharge: 2013-06-18 | Disposition: A | Discharge status: Home / Self Care | Payer: No Typology Code available for payment source | Attending: Emergency Medicine | Admitting: Emergency Medicine

## 2013-06-18 ENCOUNTER — Encounter (HOSPITAL_COMMUNITY): Payer: Self-pay | Admitting: Emergency Medicine

## 2013-06-18 DIAGNOSIS — S8990XA Unspecified injury of unspecified lower leg, initial encounter: Secondary | ICD-10-CM | POA: Insufficient documentation

## 2013-06-18 DIAGNOSIS — I10 Essential (primary) hypertension: Secondary | ICD-10-CM | POA: Insufficient documentation

## 2013-06-18 DIAGNOSIS — E039 Hypothyroidism, unspecified: Secondary | ICD-10-CM | POA: Insufficient documentation

## 2013-06-18 DIAGNOSIS — Y9389 Activity, other specified: Secondary | ICD-10-CM | POA: Insufficient documentation

## 2013-06-18 DIAGNOSIS — M25569 Pain in unspecified knee: Secondary | ICD-10-CM

## 2013-06-18 DIAGNOSIS — X500XXA Overexertion from strenuous movement or load, initial encounter: Secondary | ICD-10-CM | POA: Insufficient documentation

## 2013-06-18 DIAGNOSIS — S99919A Unspecified injury of unspecified ankle, initial encounter: Principal | ICD-10-CM

## 2013-06-18 DIAGNOSIS — Z7982 Long term (current) use of aspirin: Secondary | ICD-10-CM | POA: Insufficient documentation

## 2013-06-18 DIAGNOSIS — W010XXA Fall on same level from slipping, tripping and stumbling without subsequent striking against object, initial encounter: Secondary | ICD-10-CM | POA: Insufficient documentation

## 2013-06-18 DIAGNOSIS — M1990 Unspecified osteoarthritis, unspecified site: Secondary | ICD-10-CM | POA: Insufficient documentation

## 2013-06-18 DIAGNOSIS — J45909 Unspecified asthma, uncomplicated: Secondary | ICD-10-CM | POA: Insufficient documentation

## 2013-06-18 DIAGNOSIS — S99929A Unspecified injury of unspecified foot, initial encounter: Principal | ICD-10-CM

## 2013-06-18 DIAGNOSIS — Z8701 Personal history of pneumonia (recurrent): Secondary | ICD-10-CM | POA: Insufficient documentation

## 2013-06-18 DIAGNOSIS — Y929 Unspecified place or not applicable: Secondary | ICD-10-CM | POA: Insufficient documentation

## 2013-06-18 DIAGNOSIS — E785 Hyperlipidemia, unspecified: Secondary | ICD-10-CM | POA: Insufficient documentation

## 2013-06-18 DIAGNOSIS — Z79899 Other long term (current) drug therapy: Secondary | ICD-10-CM | POA: Insufficient documentation

## 2013-06-18 DIAGNOSIS — Z8719 Personal history of other diseases of the digestive system: Secondary | ICD-10-CM | POA: Insufficient documentation

## 2013-06-18 NOTE — ED Provider Notes (Signed)
CSN: [id number]     Arrival date & time 06/18/13  1501 History  This chart was scribed for non-physician practitioner Cleatrice Burke, PA-C working with Virgel Manifold, MD by Eston Mould, ED Scribe. This patient was seen in room WTR8/WTR8 and the patient's care was started at 3:43 PM .   Chief Complaint  Patient presents with  . Knee Pain   The history is provided by the patient. No language interpreter was used.   HPI Comments: Eric Cordova is a 63 y.o. male who presents to the Emergency Department complaining of sudden L knee pain that began "a few days ago". Pt states he was power walking about 4 days ago and felt his L knee strain. He reports hearing "a snap" 4 nights ago. He states his dog tripped him out of bed, reports falling and injuring his L knee. Pt describes the knee pain as a throbbing sensation and reports having difficulty when bearing weight due to pain. He has been having intermittent tingling to his L toes. He will be moving in June to [place] and is seeking orthopedic help. Denies his knee feeling unstable or knee giving out causing him to fall. His knee does not lock up.  Past Medical History  Diagnosis Date  . Diverticulitis   . Constipation   . Pneumonia   . Incisional hernia   . Hyperlipidemia   . Hypothyroidism   . Allergy   . Degenerative joint disease   . Asthma   . Hypertension   . Prediabetes    Past Surgical History  Procedure Laterality Date  . Tonsillectomy  1958  . Hernia repair  1960, 1974    inguinal bilateral  . Pilonidal cyst excision  1973  . Nasal septum surgery  1982  . Hydrocele excision  1984  . Colon surgery 2012  colon surgery 11/2010  . Ventral hernia repair  11/07/2011    Procedure: LAPAROSCOPIC VENTRAL HERNIA;  Surgeon: Gwenyth Ober, MD;  Location: [place];  Service: General;  Laterality: N/A;  . Insertion of mesh  11/07/2011    Procedure: INSERTION OF MESH;  Surgeon: Gwenyth Ober, MD;  Location: [place];  Service:  General;  Laterality: N/A;   Family History  Problem Relation Age of Onset  . Cancer Mother     breast  . Thyroid disease Mother   . Asthma Mother   . Cancer Father     prostate  . Dementia Father   . Heart disease Father   . Cancer Maternal Uncle     prostate  . Hypertension Brother   . Arthritis Maternal Grandmother    History  Substance Use Topics  . Smoking status: Never Smoker   . Smokeless tobacco: Never Used  . Alcohol Use: 1.8 oz/week    3 Shots of liquor per week     Comment: dinks couple shots 2-3x/week    Review of Systems  Constitutional: Negative for fever and chills.  Respiratory: Negative for shortness of breath.   Cardiovascular: Negative for chest pain.  Musculoskeletal: Positive for arthralgias, gait problem, joint swelling and myalgias.  Skin: Negative for color change and wound.  All other systems reviewed and are negative.  Allergies  Review of patient's allergies indicates no known allergies.  Home Medications   Prior to Admission medications   Medication Sig Start Date End Date Taking? Authorizing Provider  ALPRAZolam Duanne Moron) 0.5 MG tablet Take 0.25 mg by mouth at bedtime as needed. For sleep/anxiety 09/26/10   Historical Provider,  MD  aspirin 325 MG tablet Take 325 mg by mouth daily.     Historical Provider, MD  Calcium Carbonate-Vit D-Min (CALCIUM 1200 PO) Take 1 tablet by mouth 2 (two) times daily.     Historical Provider, MD  Cholecalciferol (VITAMIN D-3 PO) Take 2,000 mg by mouth daily.      Historical Provider, MD  clonazePAM (KLONOPIN) 1 MG tablet TAKE 1 TABLET BY MOUTH EVERY NIGHT AT BEDTIME 04/19/13   Melissa R Smith, PA-C  fluocinolone (SYNALAR) 0.01 % external solution Apply topically 2 (two) times daily. 12/03/12   Vicie Mutters, PA-C  gemfibrozil (LOPID) 600 MG tablet Take 600 mg by mouth 2 (two) times daily before a meal.    Historical Provider, MD  levothyroxine (SYNTHROID, LEVOTHROID) 50 MCG tablet Take 50-75 mcg by mouth daily.  1.5 tablet on Monday, Wednesday and Friday; 1 tablet all other days of the week     Historical Provider, MD  traZODone (DESYREL) 150 MG tablet Take 1 tablet (150 mg total) by mouth at bedtime. 12/03/12   Vicie Mutters, PA-C  Triamcinolone Acetonide (TRIAMCINOLONE 0.1 % CREAM : EUCERIN) CREA Apply 1 application topically 2 (two) times daily as needed. 12/05/12   Vicie Mutters, PA-C  vitamin B-12 (CYANOCOBALAMIN) 1000 MCG tablet Take 1,000 mcg by mouth daily.    Historical Provider, MD   BP 140/79  Pulse 52  Temp(Src) 98.3 F (36.8 C) (Oral)  Resp 20  SpO2 100%  Physical Exam  Nursing note and vitals reviewed. Constitutional: He is oriented to person, place, and time. He appears well-developed and well-nourished. No distress.  HENT:  Head: Normocephalic and atraumatic.  Right Ear: External ear normal.  Left Ear: External ear normal.  Nose: Nose normal.  Eyes: Conjunctivae and EOM are normal.  Neck: Normal range of motion. Neck supple. No tracheal deviation present.  Cardiovascular: Normal rate, regular rhythm, normal heart sounds, intact distal pulses and normal pulses.   Pulses:      Posterior tibial pulses are 2+ on the right side, and 2+ on the left side.  Pulmonary/Chest: Effort normal and breath sounds normal. No stridor. No respiratory distress.  Abdominal: Soft. He exhibits no distension. There is no tenderness.  Musculoskeletal: Normal range of motion. He exhibits no edema.  Tender to palpation medially on left knee. No posterior or lateral tenderness. Apprehension sign negative. No erythema or warmth to the joint. Joint stable. Pt able to straight leg raise. Compartment soft.  Neurological: He is alert and oriented to person, place, and time.  Reflex Scores:      Patellar reflexes are 2+ on the right side and 2+ on the left side. Neurovascularly intact.  Skin: Skin is warm and dry. He is not diaphoretic.  Psychiatric: He has a normal mood and affect. His behavior is normal.    ED Course  Procedures  DIAGNOSTIC STUDIES: Oxygen Saturation is 98% on RA, normal by my interpretation.    COORDINATION OF CARE: 3:50 PM-Discussed treatment plan which includes L ankle X-ray, provide pt with exercise guide, apply ice/heat, and rest. Pt agreed to plan.   Labs Review Labs Reviewed - No data to display  Imaging Review Dg Knee Complete 4 Views Left  06/18/2013   CLINICAL DATA:  Fall, left knee pain all over  EXAM: LEFT KNEE - COMPLETE 4+ VIEW  COMPARISON:  None.  FINDINGS: No acute fracture or malalignment. No supra patellar joint effusion. Mild soft tissue thickening overlying the distal rectus femoris may represent contusion. Normal bony  mineralization. No lytic or blastic osseous lesion. No significant degenerative change. Incidental note is made of a fabella.  IMPRESSION: 1. No acute fracture, malalignment or joint effusion. 2. Mild soft tissue swelling over the anterior distal thigh, query contusion?   Electronically Signed   By: Jacqulynn Cadet M.D.   On: 06/18/2013 16:30     EKG Interpretation None     MDM   Final diagnoses:  Knee pain    Patient presents to the ED with knee pain. Joint is stable, compartment soft, neurovascularly intact. Patient was given a knee sleeve for comfort. He was given strengthening and stretching exercises. XR is negative for acute fracture, malalignment, or joint effusion. He was given orthopedic follow up. Return instructions given. Vital signs stable for discharge. Patient / Family / Caregiver informed of clinical course, understand medical decision-making process, and agree with plan.  I personally performed the services described in this documentation, which was scribed in my presence. The recorded information has been reviewed and is accurate.     Elwyn Lade, PA-C 06/18/13 1812

## 2013-06-18 NOTE — Discharge Instructions (Signed)
Knee Pain Knee pain can be a result of an injury or other medical conditions. Treatment will depend on the cause of your pain. HOME CARE  Only take medicine as told by your doctor.  Keep a healthy weight. Being overweight can make the knee hurt more.  Stretch before exercising or playing sports.  If there is constant knee pain, change the way you exercise. Ask your doctor for advice.  Make sure shoes fit well. Choose the right shoe for the sport or activity.  Protect your knees. Wear kneepads if needed.  Rest when you are tired. GET HELP RIGHT AWAY IF:   Your knee pain does not stop.  Your knee pain does not get better.  Your knee joint feels hot to the touch.  You have a fever. MAKE SURE YOU:   Understand these instructions.  Will watch this condition.  Will get help right away if you are not doing well or get worse. Document Released: 03/25/2008 Document Revised: 03/21/2011 Document Reviewed: 03/25/2008 [employer] Patient Information 2014 [employer].  

## 2013-06-18 NOTE — ED Notes (Addendum)
Pt states he was powerwalking a few days ago and felt his left knee strain, last night patient's dog tripped him out of bed, he fell onto and injured the left knee. Knees are asymmetrical, left knee is swollen, reddened. Patient able to bear weight on both knees. Took ibuprofen at 1430.

## 2013-06-23 NOTE — ED Provider Notes (Signed)
Medical screening examination/treatment/procedure(s) were performed by non-physician practitioner and as supervising physician I was immediately available for consultation/collaboration.   EKG Interpretation None       Virgel Manifold, MD 06/23/13 [phone]

## 2014-06-18 ENCOUNTER — Other Ambulatory Visit: Payer: Self-pay | Admitting: Internal Medicine

## 2014-10-05 ENCOUNTER — Ambulatory Visit (INDEPENDENT_AMBULATORY_CARE_PROVIDER_SITE_OTHER): Payer: 59 | Admitting: Family Medicine

## 2014-10-05 ENCOUNTER — Other Ambulatory Visit: Payer: Self-pay | Admitting: Internal Medicine

## 2014-10-05 VITALS — BP 116/68 | HR 53 | Temp 98.0°F | Resp 16 | Ht 67.0 in | Wt 220.0 lb

## 2014-10-05 DIAGNOSIS — N481 Balanitis: Secondary | ICD-10-CM | POA: Diagnosis not present

## 2014-10-05 MED ORDER — CLOTRIMAZOLE 1 % EX CREA: 1.0000 "application " | TOPICAL_CREAM | Freq: Two times a day (BID) | CUTANEOUS | Status: DC

## 2014-10-05 NOTE — Progress Notes (Signed)
Subjective:  This chart was scribed for Merri Ray, MD by Moises Blood, Medical Scribe. This patient was seen in room 9 and the patient's care was started 9:05 AM.    Patient ID: Eric Cordova, male    DOB: Jun 01, 1950, 64 y.o.   MRN: [id number]  HPI Eric Cordova is a 64 y.o. male Pt has a rash on penis for 3 months. It started with little, red bumps and looks swollen. It's become itchy in the area. He says there's some discomfort at the tip of the penis when he urinates. He states that he has similar issues at his armpits. He has used polysporin. He denies blisters in the area, penile discharge, testicular pain and dysuria.   He's had similar symptoms in the past. He believes it is due to change in laundry detergent: it has advanced technology to get blood stains out. Recently, he switched back to sensitive-free "All" brand for a few weeks. In the past, it was due to change in detergent too.   He had a recent PE in sept, and his blood sugar was elevated but was not diagnosed with diabetes.   He works as an Marine scientist.  He wants to switch primary care to here due to his work schedule.   Patient Active Problem List   Diagnosis Date Noted  . Unspecified vitamin D deficiency 12/03/2012  . Hyperlipidemia   . Hypothyroidism   . Allergy   . Degenerative joint disease   . Asthma   . Hypertension   . Prediabetes   . Incisional hernia 08/16/2011  . Postop check 11/30/2010  . Diverticulosis of colon 10/19/2010  . ABDOMINAL PAIN, GENERALIZED 03/04/2009  . DIARRHEA-PRESUMED INFECTIOUS 02/19/2009  . DIVERTICULITIS-COLON 02/19/2009  . ABNORMAL FINDINGS GI TRACT 02/19/2009   Past Medical History  Diagnosis Date  . Diverticulitis   . Constipation   . Pneumonia   . Incisional hernia   . Hyperlipidemia   . Hypothyroidism   . Allergy   . Degenerative joint disease   . Asthma   . Hypertension   . Prediabetes    Past Surgical History  Procedure Laterality Date  .  Tonsillectomy  1958  . Hernia repair  1960, 1974    inguinal bilateral  . Pilonidal cyst excision  1973  . Nasal septum surgery  1982  . Hydrocele excision  1984  . Colon surgery 2012  colon surgery 11/2010  . Ventral hernia repair  11/07/2011    Procedure: LAPAROSCOPIC VENTRAL HERNIA;  Surgeon: Gwenyth Ober, MD;  Location: [place];  Service: General;  Laterality: N/A;  . Insertion of mesh  11/07/2011    Procedure: INSERTION OF MESH;  Surgeon: Gwenyth Ober, MD;  Location: [place];  Service: General;  Laterality: N/A;   No Known Allergies Prior to Admission medications   Medication Sig Start Date End Date Taking? Authorizing Provider  aspirin 325 MG tablet Take 325 mg by mouth daily.    Yes Historical Provider, MD  Cholecalciferol (VITAMIN D-3 PO) Take 2,000 mg by mouth daily.     Yes Historical Provider, MD  gemfibrozil (LOPID) 600 MG tablet Take 600 mg by mouth 2 (two) times daily before a meal.   Yes Historical Provider, MD  levothyroxine (SYNTHROID, LEVOTHROID) 50 MCG tablet Take 50-75 mcg by mouth daily. 1.5 tablet on Monday, Wednesday and Friday; 1 tablet all other days of the week    Yes Historical Provider, MD   Social History   Social History  .  Marital Status: Married    Spouse Name: N/A  . Number of Children: N/A  . Years of Education: N/A   Occupational History  . Not on file.   Social History Main Topics  . Smoking status: Never Smoker   . Smokeless tobacco: Never Used  . Alcohol Use: 1.8 oz/week    3 Shots of liquor per week     Comment: dinks couple shots 2-3x/week  . Drug Use: No  . Sexual Activity: Not on file   Other Topics Concern  . Not on file   Social History Narrative     Review of Systems  Genitourinary: Positive for penile swelling. Negative for dysuria, decreased urine volume, discharge, difficulty urinating and testicular pain.  Skin: Positive for rash (penis).       Objective:   Physical Exam  Constitutional: He is oriented to person,  place, and time. He appears well-developed and well-nourished.  HENT:  Head: Normocephalic and atraumatic.  Eyes: EOM are normal. Pupils are equal, round, and reactive to light.  Neck: No JVD present. Carotid bruit is not present.  Cardiovascular: Normal rate, regular rhythm and normal heart sounds.   No murmur heard. Pulmonary/Chest: Effort normal and breath sounds normal. He has no rales.  Genitourinary:  penile head has patchy erythema with fine papules slight scaling distally with minimal swelling, penile shaft and foreskin erythema minimal erythema and dry scale inferior scrotum  Musculoskeletal: He exhibits no edema.  Neurological: He is alert and oriented to person, place, and time.  Skin: Skin is warm and dry. Rash noted.  Axilla redness bilaterally  Psychiatric: He has a normal mood and affect.  Vitals reviewed.   Filed Vitals:   10/05/14 0849  BP: 116/68  Pulse: 53  Temp: 98 F (36.7 C)  TempSrc: Oral  Resp: 16  Height: 5\' 7"  (1.702 m)  Weight: 220 lb (99.791 kg)  SpO2: 98%         Assessment & Plan:   Eric Cordova is a 64 y.o. male Balanitis - Plan: clotrimazole (LOTRIMIN) 1 % cream Your 10/contact dermatitis type cause initially suspected with change in detergent, but with persistent symptoms, candidal or fungal balanitis possible. He apparently has had elevated blood sugars in the past, and was evaluated recently without diagnosis of diabetes. Advised to have this followed up as diabetes is a risk factor for candidal balanitis.  -clotrimazole twice a day to affected area, and over-the-counter hydrocortisone twice a day as needed for itching.  -Handout given and RTC precautions discussed.   Meds ordered this encounter  Medications  . clotrimazole (LOTRIMIN) 1 % cream    Sig: Apply 1 application topically 2 (two) times daily. Until resolution of symptoms, then use for additional 4-5 days.    Dispense:  30 g    Refill:  0   Patient Instructions    Your infection appears to be a condition called balanitis. This can sometimes be from a fungus, so start clotrimazole cream to affected area twice per day until symptoms resolve, then continue for additional 4-5 days. For itching you can use over-the-counter hydrocortisone 1% to affected area twice per day as needed.  If not improving within the next week to 10 days, let me know and we can have urology evaluate you. As we discussed, high blood sugars can sometimes put you at risk for this condition. Would recommend you follow-up with your primary care provider to make sure that you do not have prediabetes or diabetes.  Return to  the clinic or go to the nearest emergency room if any of your symptoms worsen or new symptoms occur.  Balanitis Balanitis is inflammation of the head of the penis (glans).  CAUSES  Balanitis has multiple causes, both infectious and noninfectious. Often balanitis is the result of poor personal hygiene, especially in uncircumcised males. Without adequate washing, viruses, bacteria, and yeast collect between the foreskin and the glans. This can cause an infection. Lack of air and irritation from a normal secretion called smegma contribute to the cause in uncircumcised males. Other causes include:  Chemical irritation from the use of certain soaps and shower gels (especially soaps with perfumes), condoms, personal lubricants, petroleum jelly, spermicides, and fabric conditioners.  Skin conditions, such as eczema, dermatitis, and psoriasis.  Allergies to drugs, such as tetracycline and sulfa.  Certain medical conditions, including liver cirrhosis, congestive heart failure, and kidney disease.  Morbid obesity. RISK FACTORS  Diabetes mellitus.  A tight foreskin that is difficult to pull back past the glans (phimosis).  Sex without the use of a condom. SIGNS AND SYMPTOMS  Symptoms may include:  Discharge coming from under the foreskin.  Tenderness.  Itching and  inability to get an erection (because of the pain).  Redness and a rash.  Sores on the glans and on the foreskin. DIAGNOSIS Diagnosis of balanitis is confirmed through a physical exam. TREATMENT The treatment is based on the cause of the balanitis. Treatment may include:  Frequent cleansing.  Keeping the glans and foreskin dry.  Use of medicines such as creams, pain medicines, antibiotics, or medicines to treat fungal infections.  Sitz baths. If the irritation has caused a scar on the foreskin that prevents easy retraction, a circumcision may be recommended.  HOME CARE INSTRUCTIONS  Sex should be avoided until the condition has cleared. MAKE SURE YOU:  Understand these instructions.  Will watch your condition.  Will get help right away if you are not doing well or get worse. Document Released: 05/15/2008 Document Revised: 01/01/2013 Document Reviewed: 06/18/2012 [facility] Patient Information 2015 [place], [place]. This information is not intended to replace advice given to you by your health care provider. Make sure you discuss any questions you have with your health care provider.      By signing my name below, I, Moises Blood, attest that this documentation has been prepared under the direction and in the presence of Merri Ray, MD. Electronically Signed: Moises Blood, [place]. 10/05/2014 , 9:02 AM .  I personally performed the services described in this documentation, which was scribed in my presence. The recorded information has been reviewed and considered, and addended by me as needed.

## 2014-10-05 NOTE — Patient Instructions (Signed)
Your infection appears to be a condition called balanitis. This can sometimes be from a fungus, so start clotrimazole cream to affected area twice per day until symptoms resolve, then continue for additional 4-5 days. For itching you can use over-the-counter hydrocortisone 1% to affected area twice per day as needed.  If not improving within the next week to 10 days, let me know and we can have urology evaluate you. As we discussed, high blood sugars can sometimes put you at risk for this condition. Would recommend you follow-up with your primary care provider to make sure that you do not have prediabetes or diabetes.  Return to the clinic or go to the nearest emergency room if any of your symptoms worsen or new symptoms occur.  Balanitis Balanitis is inflammation of the head of the penis (glans).  CAUSES  Balanitis has multiple causes, both infectious and noninfectious. Often balanitis is the result of poor personal hygiene, especially in uncircumcised males. Without adequate washing, viruses, bacteria, and yeast collect between the foreskin and the glans. This can cause an infection. Lack of air and irritation from a normal secretion called smegma contribute to the cause in uncircumcised males. Other causes include:  Chemical irritation from the use of certain soaps and shower gels (especially soaps with perfumes), condoms, personal lubricants, petroleum jelly, spermicides, and fabric conditioners.  Skin conditions, such as eczema, dermatitis, and psoriasis.  Allergies to drugs, such as tetracycline and sulfa.  Certain medical conditions, including liver cirrhosis, congestive heart failure, and kidney disease.  Morbid obesity. RISK FACTORS  Diabetes mellitus.  A tight foreskin that is difficult to pull back past the glans (phimosis).  Sex without the use of a condom. SIGNS AND SYMPTOMS  Symptoms may include:  Discharge coming from under the foreskin.  Tenderness.  Itching and  inability to get an erection (because of the pain).  Redness and a rash.  Sores on the glans and on the foreskin. DIAGNOSIS Diagnosis of balanitis is confirmed through a physical exam. TREATMENT The treatment is based on the cause of the balanitis. Treatment may include:  Frequent cleansing.  Keeping the glans and foreskin dry.  Use of medicines such as creams, pain medicines, antibiotics, or medicines to treat fungal infections.  Sitz baths. If the irritation has caused a scar on the foreskin that prevents easy retraction, a circumcision may be recommended.  HOME CARE INSTRUCTIONS  Sex should be avoided until the condition has cleared. MAKE SURE YOU:  Understand these instructions.  Will watch your condition.  Will get help right away if you are not doing well or get worse. Document Released: 05/15/2008 Document Revised: 01/01/2013 Document Reviewed: 06/18/2012 [facility] Patient Information 2015 [place], [place]. This information is not intended to replace advice given to you by your health care provider. Make sure you discuss any questions you have with your health care provider.

## 2014-10-17 ENCOUNTER — Encounter: Payer: Self-pay | Admitting: Internal Medicine

## 2014-11-03 ENCOUNTER — Ambulatory Visit: Payer: 59 | Admitting: Family Medicine

## 2014-12-03 ENCOUNTER — Ambulatory Visit (INDEPENDENT_AMBULATORY_CARE_PROVIDER_SITE_OTHER): Payer: 59 | Admitting: Family Medicine

## 2014-12-03 VITALS — BP 123/78 | HR 55 | Temp 98.0°F | Resp 18 | Ht 66.0 in | Wt 215.0 lb

## 2014-12-03 DIAGNOSIS — N481 Balanitis: Secondary | ICD-10-CM | POA: Diagnosis not present

## 2014-12-03 DIAGNOSIS — R350 Frequency of micturition: Secondary | ICD-10-CM

## 2014-12-03 DIAGNOSIS — R319 Hematuria, unspecified: Secondary | ICD-10-CM

## 2014-12-03 LAB — POCT URINALYSIS DIP (MANUAL ENTRY)
Bilirubin, UA: NEGATIVE
Glucose, UA: NEGATIVE
Ketones, POC UA: NEGATIVE
Leukocytes, UA: NEGATIVE
Nitrite, UA: NEGATIVE
Protein Ur, POC: 100 — AB
Spec Grav, UA: 1.025
Urobilinogen, UA: 0.2
pH, UA: 5

## 2014-12-03 LAB — POC MICROSCOPIC URINALYSIS (UMFC): Mucus: ABSENT

## 2014-12-03 LAB — GLUCOSE, POCT (MANUAL RESULT ENTRY): POC Glucose: 97 mg/dl (ref 70–99)

## 2014-12-03 NOTE — Progress Notes (Signed)
Subjective:  This chart was scribed for Merri Ray, MD by Thea Alken, ED Scribe. This patient was seen in room 10 and the patient's care was started at 6:15 PM.   Patient ID: Eric Cordova, male    DOB: 12/19/1950, 64 y.o.   MRN: [id number]  HPI Chief Complaint  Patient presents with  . Follow-up    Was diagnosed with Balanitis in Septemebr. Sx's improved slightly & worse in the past 10-14 days     HPI Comments: Eric Cordova is a 64 y.o. male who presents to the [facility] for a recheck. Was diagnosed with balanitis 9/2. Symptoms for approximately 3 months. Thought to be possible initial dermatis after change in detergent than secondary balanitis. prescription for Lotrimin cream and hydrocortisone if needed for itching. Was advised to f/u with PCP for bs recheck.   Pt reports rash improved but still persist. Rash started to approved after using lotrimin cream twice a day for 3 weeks. He stopped using rash for a couple weeks in October but restarted. He recently stopped using cream a couple days ago. He will occasionally has discomfort at the end of urination. He's also had urinary frequency. He denies fever, chills. Pt states sugar was 99 when checked at Dr. Ival Bible office.   He's continued to used All free detergent and dove soap.   Patient Active Problem List   Diagnosis Date Noted  . Unspecified vitamin D deficiency 12/03/2012  . Hyperlipidemia   . Hypothyroidism   . Allergy   . Degenerative joint disease   . Asthma   . Hypertension   . Prediabetes   . Incisional hernia 08/16/2011  . Postop check 11/30/2010  . Diverticulosis of colon 10/19/2010  . ABDOMINAL PAIN, GENERALIZED 03/04/2009  . DIARRHEA-PRESUMED INFECTIOUS 02/19/2009  . DIVERTICULITIS-COLON 02/19/2009  . ABNORMAL FINDINGS GI TRACT 02/19/2009   Past Medical History  Diagnosis Date  . Diverticulitis   . Constipation   . Pneumonia   . Incisional hernia   . Hyperlipidemia   .  Hypothyroidism   . Allergy   . Degenerative joint disease   . Asthma   . Hypertension   . Prediabetes    Past Surgical History  Procedure Laterality Date  . Tonsillectomy  1958  . Hernia repair  1960, 1974    inguinal bilateral  . Pilonidal cyst excision  1973  . Nasal septum surgery  1982  . Hydrocele excision  1984  . Colon surgery 2012  colon surgery 11/2010  . Ventral hernia repair  11/07/2011    Procedure: LAPAROSCOPIC VENTRAL HERNIA;  Surgeon: Gwenyth Ober, MD;  Location: [place];  Service: General;  Laterality: N/A;  . Insertion of mesh  11/07/2011    Procedure: INSERTION OF MESH;  Surgeon: Gwenyth Ober, MD;  Location: [place];  Service: General;  Laterality: N/A;   No Known Allergies Prior to Admission medications   Medication Sig Start Date End Date Taking? Authorizing Provider  aspirin 325 MG tablet Take 325 mg by mouth daily.    Yes Historical Provider, MD  Cholecalciferol (VITAMIN D-3 PO) Take 2,000 mg by mouth daily.     Yes Historical Provider, MD  clotrimazole (LOTRIMIN) 1 % cream Apply 1 application topically 2 (two) times daily. Until resolution of symptoms, then use for additional 4-5 days. 10/05/14  Yes Wendie Agreste, MD  gemfibrozil (LOPID) 600 MG tablet Take 600 mg by mouth 2 (two) times daily before a meal.   Yes Historical  Provider, MD  levothyroxine (SYNTHROID, LEVOTHROID) 50 MCG tablet Take 50-75 mcg by mouth daily. 1.5 tablet on Monday, Wednesday and Friday; 1 tablet all other days of the week    Yes Historical Provider, MD   Social History   Social History  . Marital Status: Married    Spouse Name: N/A  . Number of Children: N/A  . Years of Education: N/A   Occupational History  . Not on file.   Social History Main Topics  . Smoking status: Never Smoker   . Smokeless tobacco: Never Used  . Alcohol Use: 1.8 oz/week    3 Shots of liquor per week     Comment: dinks couple shots 2-3x/week  . Drug Use: No  . Sexual Activity: Not on file   Other  Topics Concern  . Not on file   Social History Narrative   Review of Systems  Constitutional: Negative for fever and chills.  Genitourinary: Positive for frequency.  Skin: Positive for rash.   Objective:   Physical Exam  Constitutional: He is oriented to person, place, and time. He appears well-developed and well-nourished. No distress.  HENT:  Head: Normocephalic and atraumatic.  Eyes: Conjunctivae and EOM are normal.  Neck: Neck supple.  Cardiovascular: Normal rate.   Pulmonary/Chest: Effort normal.  Genitourinary:  Few scattered erythematous papules with patchy appearance of skin. Dry appearance on head of penis only. No rash on remainder of penis or skin.   Musculoskeletal: Normal range of motion.  Neurological: He is alert and oriented to person, place, and time.  Skin: Skin is warm and dry.  Psychiatric: He has a normal mood and affect. His behavior is normal.  Nursing note and vitals reviewed.  Results for orders placed or performed in visit on 12/03/14  POCT urinalysis dipstick  Result Value Ref Range   Color, UA yellow yellow   Clarity, UA clear clear   Glucose, UA negative negative   Bilirubin, UA negative negative   Ketones, POC UA negative negative   Spec Grav, UA 1.025    Blood, UA small (A) negative   pH, UA 5.0    Protein Ur, POC =100 (A) negative   Urobilinogen, UA 0.2    Nitrite, UA Negative Negative   Leukocytes, UA Negative Negative  POCT Microscopic Urinalysis ([facility])  Result Value Ref Range   WBC,UR,HPF,POC None None WBC/hpf   RBC,UR,HPF,POC Few (A) None RBC/hpf   Bacteria None None, Too numerous to count   Mucus Absent Absent   Epithelial Cells, UR Per Microscopy None None, Too numerous to count cells/hpf  POCT glucose (manual entry)  Result Value Ref Range   POC Glucose 97 70 - 99 mg/dl   Assessment & Plan:  Eric Cordova is a 64 y.o. male Urinary frequency - Plan: POCT urinalysis dipstick, POCT Microscopic Urinalysis ([facility]), PSA,  Ambulatory referral to Urology, CANCELED: Ambulatory referral to Urology  - PSA pending. No sign of acute infection on exam in office. Will also have a referral to urology  Balanitis - Plan: POCT glucose (manual entry), Ambulatory referral to Urology, CANCELED: Ambulatory referral to Urology, CANCELED: Ambulatory referral to Urology  -Recurrent/persistent. Less likely fungal source or infectious source at this point. Differential includes lichen sclerosus, plasma cell, or other premalignant lesion, so referred to urology for further evaluation and workup, which may involve biopsy, but will have them evaluate him first.   -Okay to use hydrocortisone cream for now if needed.   Hematuria - Plan: PSA, Ambulatory referral to Urology,  CANCELED: Ambulatory referral to Urology  -Microscopic. Asymptomatic. PSA pending. Refer to urology.   No orders of the defined types were placed in this encounter.   Patient Instructions  As the rash has persisted, I will refer you to urology for further evaluation and workup if needed. Okay to use the hydrocortisone cream up to twice per day as needed for now, return here sooner if symptoms worsen prior to urology eval.  We also found a few red blood cells are trace blood in your urine today. This can also be followed up with urology.  Return to the clinic or go to the nearest emergency room if any of your symptoms worsen or new symptoms occur.   Balanitis Balanitis is inflammation of the head of the penis (glans).  CAUSES  Balanitis has multiple causes, both infectious and noninfectious. Often balanitis is the result of poor personal hygiene, especially in uncircumcised males. Without adequate washing, viruses, bacteria, and yeast collect between the foreskin and the glans. This can cause an infection. Lack of air and irritation from a normal secretion called smegma contribute to the cause in uncircumcised males. Other causes include:  Chemical irritation from the  use of certain soaps and shower gels (especially soaps with perfumes), condoms, personal lubricants, petroleum jelly, spermicides, and fabric conditioners.  Skin conditions, such as eczema, dermatitis, and psoriasis.  Allergies to drugs, such as tetracycline and sulfa.  Certain medical conditions, including liver cirrhosis, congestive heart failure, and kidney disease.  Morbid obesity. RISK FACTORS  Diabetes mellitus.  A tight foreskin that is difficult to pull back past the glans (phimosis).  Sex without the use of a condom. SIGNS AND SYMPTOMS  Symptoms may include:  Discharge coming from under the foreskin.  Tenderness.  Itching and inability to get an erection (because of the pain).  Redness and a rash.  Sores on the glans and on the foreskin. DIAGNOSIS Diagnosis of balanitis is confirmed through a physical exam. TREATMENT The treatment is based on the cause of the balanitis. Treatment may include:  Frequent cleansing.  Keeping the glans and foreskin dry.  Use of medicines such as creams, pain medicines, antibiotics, or medicines to treat fungal infections.  Sitz baths. If the irritation has caused a scar on the foreskin that prevents easy retraction, a circumcision may be recommended.  HOME CARE INSTRUCTIONS  Sex should be avoided until the condition has cleared. MAKE SURE YOU:  Understand these instructions.  Will watch your condition.  Will get help right away if you are not doing well or get worse.   This information is not intended to replace advice given to you by your health care provider. Make sure you discuss any questions you have with your health care provider.   Document Released: 05/15/2008 Document Revised: 01/01/2013 Document Reviewed: 06/18/2012 Elsevier Interactive Patient Education [employer].        By signing my name below, I, Raven Small, attest that this documentation has been prepared under the direction and in the  presence of Merri Ray, MD.  Electronically Signed: Thea Alken, ED Scribe. 12/03/2014. 6:35 PM.

## 2014-12-03 NOTE — Patient Instructions (Addendum)
As the rash has persisted, I will refer you to urology for further evaluation and workup if needed. Okay to use the hydrocortisone cream up to twice per day as needed for now, return here sooner if symptoms worsen prior to urology eval.  We also found a few red blood cells are trace blood in your urine today. This can also be followed up with urology.  Return to the clinic or go to the nearest emergency room if any of your symptoms worsen or new symptoms occur.   Balanitis Balanitis is inflammation of the head of the penis (glans).  CAUSES  Balanitis has multiple causes, both infectious and noninfectious. Often balanitis is the result of poor personal hygiene, especially in uncircumcised males. Without adequate washing, viruses, bacteria, and yeast collect between the foreskin and the glans. This can cause an infection. Lack of air and irritation from a normal secretion called smegma contribute to the cause in uncircumcised males. Other causes include:  Chemical irritation from the use of certain soaps and shower gels (especially soaps with perfumes), condoms, personal lubricants, petroleum jelly, spermicides, and fabric conditioners.  Skin conditions, such as eczema, dermatitis, and psoriasis.  Allergies to drugs, such as tetracycline and sulfa.  Certain medical conditions, including liver cirrhosis, congestive heart failure, and kidney disease.  Morbid obesity. RISK FACTORS  Diabetes mellitus.  A tight foreskin that is difficult to pull back past the glans (phimosis).  Sex without the use of a condom. SIGNS AND SYMPTOMS  Symptoms may include:  Discharge coming from under the foreskin.  Tenderness.  Itching and inability to get an erection (because of the pain).  Redness and a rash.  Sores on the glans and on the foreskin. DIAGNOSIS Diagnosis of balanitis is confirmed through a physical exam. TREATMENT The treatment is based on the cause of the balanitis. Treatment may  include:  Frequent cleansing.  Keeping the glans and foreskin dry.  Use of medicines such as creams, pain medicines, antibiotics, or medicines to treat fungal infections.  Sitz baths. If the irritation has caused a scar on the foreskin that prevents easy retraction, a circumcision may be recommended.  HOME CARE INSTRUCTIONS  Sex should be avoided until the condition has cleared. MAKE SURE YOU:  Understand these instructions.  Will watch your condition.  Will get help right away if you are not doing well or get worse.   This information is not intended to replace advice given to you by your health care provider. Make sure you discuss any questions you have with your health care provider.   Document Released: 05/15/2008 Document Revised: 01/01/2013 Document Reviewed: 06/18/2012 Elsevier Interactive Patient Education [employer].

## 2014-12-06 LAB — PSA: PSA: 0.94 ng/mL (ref <=4.00)

## 2014-12-10 ENCOUNTER — Telehealth: Payer: Self-pay

## 2014-12-10 DIAGNOSIS — N481 Balanitis: Secondary | ICD-10-CM

## 2014-12-10 DIAGNOSIS — B356 Tinea cruris: Secondary | ICD-10-CM

## 2014-12-10 NOTE — Telephone Encounter (Signed)
Pt is wanting to let dr Carlota Raspberry know that the urologist can not see him til jan 10th and he is wanting to know what to do next   Please call [phone]

## 2014-12-11 NOTE — Telephone Encounter (Signed)
Would he be ok seeing other office?  If so can schedule at any other local office sooner if available. Thanks.

## 2014-12-16 MED ORDER — FLUCONAZOLE 100 MG PO TABS: 100.0000 mg | ORAL_TABLET | Freq: Every day | ORAL | Status: DC

## 2014-12-16 NOTE — Telephone Encounter (Signed)
Walking 3 miles per day, does have some sweating in underwear, then does not change right away. Rash seems to be more pronounced - on head of penis only, similar to when initially seen with some redness, but also some itching around scrotum and on scrotum. Has used clotrimazole twice per day last 4 days on head of penis only. Has not used any hydrocortisone cream.    -by description, likley candida with balanitis vs tinea cruris.   -use the clotrimazole cream to both the penis and itching areas on scrotum and skin BID  -start diflucan 100mg  QD for 5 days.   -change underwear/shower and completely dry off from shower after exercising.   -if any increasing redness/swelling or persistent dysuria in next few days - RTC for recheck.

## 2014-12-16 NOTE — Telephone Encounter (Signed)
Pt called today to speak with a Freight forwarder.  #1 Patient left a message with our lab 12/5 am and has not received a call. Per Dr. Vonna Kotyk notes, I informed patient his labs/PSA were normal. #2 Patient is concerned with waiting until 01/20/15 for his Urology appt. Offered to try another practice, but he is established with [facility] and wants to go to them. He is also is on their wait list.  #3 His main concern is what to do between now and his Urology appt, his rash is worse and more pronounced, no other new symptoms. Informed him that Dr. Carlota Raspberry would call him today to discuss. Pt [phone]

## 2014-12-18 ENCOUNTER — Other Ambulatory Visit: Payer: Self-pay | Admitting: Urology

## 2014-12-24 ENCOUNTER — Encounter (HOSPITAL_BASED_OUTPATIENT_CLINIC_OR_DEPARTMENT_OTHER): Payer: Self-pay | Admitting: *Deleted

## 2014-12-25 ENCOUNTER — Encounter (HOSPITAL_BASED_OUTPATIENT_CLINIC_OR_DEPARTMENT_OTHER): Payer: Self-pay | Admitting: *Deleted

## 2014-12-25 NOTE — Progress Notes (Addendum)
NPO AFTER MN.  ARRIVE AT 1000.  NEEDS ISTAT 8.   WILL TAKE AM MEDS W/ SIPS OF WATER.

## 2014-12-26 ENCOUNTER — Ambulatory Visit (HOSPITAL_BASED_OUTPATIENT_CLINIC_OR_DEPARTMENT_OTHER): Payer: 59 | Admitting: Anesthesiology

## 2014-12-26 ENCOUNTER — Encounter (HOSPITAL_BASED_OUTPATIENT_CLINIC_OR_DEPARTMENT_OTHER): Payer: Self-pay | Admitting: Anesthesiology

## 2014-12-26 ENCOUNTER — Encounter (HOSPITAL_BASED_OUTPATIENT_CLINIC_OR_DEPARTMENT_OTHER)
Admission: RE | Disposition: A | Discharge status: Home / Self Care | Payer: Self-pay | Source: Ambulatory Visit | Attending: Urology

## 2014-12-26 ENCOUNTER — Ambulatory Visit (HOSPITAL_BASED_OUTPATIENT_CLINIC_OR_DEPARTMENT_OTHER)
Admission: RE | Admit: 2014-12-26 | Discharge: 2014-12-26 | Disposition: A | Discharge status: Home / Self Care | Payer: 59 | Source: Ambulatory Visit | Attending: Urology | Admitting: Urology

## 2014-12-26 DIAGNOSIS — I10 Essential (primary) hypertension: Secondary | ICD-10-CM | POA: Diagnosis not present

## 2014-12-26 DIAGNOSIS — Z6833 Body mass index (BMI) 33.0-33.9, adult: Secondary | ICD-10-CM | POA: Insufficient documentation

## 2014-12-26 DIAGNOSIS — J45909 Unspecified asthma, uncomplicated: Secondary | ICD-10-CM | POA: Insufficient documentation

## 2014-12-26 DIAGNOSIS — Z79899 Other long term (current) drug therapy: Secondary | ICD-10-CM | POA: Insufficient documentation

## 2014-12-26 DIAGNOSIS — R3915 Urgency of urination: Secondary | ICD-10-CM | POA: Diagnosis not present

## 2014-12-26 DIAGNOSIS — E669 Obesity, unspecified: Secondary | ICD-10-CM | POA: Insufficient documentation

## 2014-12-26 DIAGNOSIS — R234 Changes in skin texture: Secondary | ICD-10-CM | POA: Insufficient documentation

## 2014-12-26 DIAGNOSIS — M1990 Unspecified osteoarthritis, unspecified site: Secondary | ICD-10-CM | POA: Insufficient documentation

## 2014-12-26 DIAGNOSIS — E785 Hyperlipidemia, unspecified: Secondary | ICD-10-CM | POA: Diagnosis not present

## 2014-12-26 DIAGNOSIS — E039 Hypothyroidism, unspecified: Secondary | ICD-10-CM | POA: Diagnosis not present

## 2014-12-26 DIAGNOSIS — N481 Balanitis: Secondary | ICD-10-CM | POA: Insufficient documentation

## 2014-12-26 DIAGNOSIS — Z7982 Long term (current) use of aspirin: Secondary | ICD-10-CM | POA: Diagnosis not present

## 2014-12-26 HISTORY — DX: Personal history of other diseases of the digestive system: Z87.19

## 2014-12-26 HISTORY — DX: Diverticulosis of large intestine without perforation or abscess without bleeding: K57.30

## 2014-12-26 HISTORY — PX: PENILE BIOPSY: SHX6013

## 2014-12-26 HISTORY — PX: CYSTOSCOPY: SHX5120

## 2014-12-26 HISTORY — DX: Neoplasm of uncertain behavior of other specified male genital organs: D40.8

## 2014-12-26 LAB — POCT I-STAT, CHEM 8
BUN: 18 mg/dL (ref 6–20)
CREATININE: 0.9 mg/dL (ref 0.61–1.24)
Calcium, Ion: 1.22 mmol/L (ref 1.13–1.30)
Chloride: 105 mmol/L (ref 101–111)
Glucose, Bld: 112 mg/dL — ABNORMAL HIGH (ref 65–99)
HEMATOCRIT: 47 % (ref 39.0–52.0)
HEMOGLOBIN: 16 g/dL (ref 13.0–17.0)
POTASSIUM: 4.3 mmol/L (ref 3.5–5.1)
SODIUM: 141 mmol/L (ref 135–145)
TCO2: 25 mmol/L (ref 0–100)

## 2014-12-26 SURGERY — BIOPSY, PENIS
Anesthesia: General

## 2014-12-26 MED ORDER — OXYCODONE-ACETAMINOPHEN 5-325 MG PO TABS: 1.0000 | ORAL_TABLET | Freq: Four times a day (QID) | ORAL | Status: DC | PRN

## 2014-12-26 MED ORDER — LACTATED RINGERS IV SOLN
INTRAVENOUS | Status: DC
  Administered 2014-12-26: 10:00:00 via INTRAVENOUS
  Filled 2014-12-26: qty 1000

## 2014-12-26 MED ORDER — MIDAZOLAM HCL 5 MG/5ML IJ SOLN
INTRAMUSCULAR | Status: DC | PRN
  Administered 2014-12-26: 2 mg via INTRAVENOUS

## 2014-12-26 MED ORDER — DEXTROSE 5 % IV SOLN
5.0000 mg/kg | INTRAVENOUS | Status: DC
  Filled 2014-12-26: qty 12.25

## 2014-12-26 MED ORDER — BUPIVACAINE HCL (PF) 0.25 % IJ SOLN
INTRAMUSCULAR | Status: AC
  Filled 2014-12-26: qty 30

## 2014-12-26 MED ORDER — FENTANYL CITRATE (PF) 100 MCG/2ML IJ SOLN
INTRAMUSCULAR | Status: AC
  Filled 2014-12-26: qty 4

## 2014-12-26 MED ORDER — ONDANSETRON HCL 4 MG/2ML IJ SOLN
INTRAMUSCULAR | Status: AC
  Filled 2014-12-26: qty 2

## 2014-12-26 MED ORDER — DEXAMETHASONE SODIUM PHOSPHATE 10 MG/ML IJ SOLN
INTRAMUSCULAR | Status: DC | PRN
  Administered 2014-12-26: 10 mg via INTRAVENOUS

## 2014-12-26 MED ORDER — LIDOCAINE HCL (CARDIAC) 20 MG/ML IV SOLN
INTRAVENOUS | Status: DC | PRN
  Administered 2014-12-26: 60 mg via INTRAVENOUS

## 2014-12-26 MED ORDER — MIDAZOLAM HCL 2 MG/2ML IJ SOLN
INTRAMUSCULAR | Status: AC
  Filled 2014-12-26: qty 2

## 2014-12-26 MED ORDER — FENTANYL CITRATE (PF) 100 MCG/2ML IJ SOLN
INTRAMUSCULAR | Status: DC | PRN
  Administered 2014-12-26: 50 ug via INTRAVENOUS

## 2014-12-26 MED ORDER — FENTANYL CITRATE (PF) 100 MCG/2ML IJ SOLN
INTRAMUSCULAR | Status: AC
  Filled 2014-12-26: qty 2

## 2014-12-26 MED ORDER — FENTANYL CITRATE (PF) 100 MCG/2ML IJ SOLN
25.0000 ug | INTRAMUSCULAR | Status: DC | PRN
Start: 2014-12-26 — End: 2014-12-26
  Filled 2014-12-26: qty 1

## 2014-12-26 MED ORDER — PROPOFOL 10 MG/ML IV BOLUS
INTRAVENOUS | Status: AC
  Filled 2014-12-26: qty 20

## 2014-12-26 MED ORDER — STERILE WATER FOR IRRIGATION IR SOLN
Status: DC | PRN
  Administered 2014-12-26: 3000 mL

## 2014-12-26 MED ORDER — PROMETHAZINE HCL 25 MG/ML IJ SOLN
6.2500 mg | INTRAMUSCULAR | Status: DC | PRN
  Filled 2014-12-26: qty 1

## 2014-12-26 MED ORDER — KETOROLAC TROMETHAMINE 30 MG/ML IJ SOLN
INTRAMUSCULAR | Status: AC
  Filled 2014-12-26: qty 1

## 2014-12-26 MED ORDER — EPHEDRINE SULFATE 50 MG/ML IJ SOLN
INTRAMUSCULAR | Status: AC
  Filled 2014-12-26: qty 1

## 2014-12-26 MED ORDER — DEXAMETHASONE SODIUM PHOSPHATE 10 MG/ML IJ SOLN
INTRAMUSCULAR | Status: AC
  Filled 2014-12-26: qty 1

## 2014-12-26 MED ORDER — BUPIVACAINE HCL 0.25 % IJ SOLN
INTRAMUSCULAR | Status: DC | PRN
  Administered 2014-12-26: 10 mL

## 2014-12-26 MED ORDER — PROPOFOL 500 MG/50ML IV EMUL
INTRAVENOUS | Status: DC | PRN
  Administered 2014-12-26: 200 mL via INTRAVENOUS

## 2014-12-26 MED ORDER — LIDOCAINE HCL (CARDIAC) 20 MG/ML IV SOLN
INTRAVENOUS | Status: AC
  Filled 2014-12-26: qty 5

## 2014-12-26 MED ORDER — SENNOSIDES-DOCUSATE SODIUM 8.6-50 MG PO TABS: 1.0000 | ORAL_TABLET | Freq: Two times a day (BID) | ORAL | Status: DC

## 2014-12-26 MED ORDER — CLINDAMYCIN PHOSPHATE 900 MG/50ML IV SOLN
900.0000 mg | INTRAVENOUS | Status: AC
  Administered 2014-12-26: 900 mg via INTRAVENOUS
  Filled 2014-12-26: qty 50

## 2014-12-26 MED ORDER — CLINDAMYCIN PHOSPHATE 900 MG/50ML IV SOLN
INTRAVENOUS | Status: AC
  Filled 2014-12-26: qty 50

## 2014-12-26 MED ORDER — EPHEDRINE SULFATE 50 MG/ML IJ SOLN
INTRAMUSCULAR | Status: DC | PRN
  Administered 2014-12-26: 15 mg via INTRAVENOUS

## 2014-12-26 MED ORDER — GENTAMICIN SULFATE 40 MG/ML IJ SOLN
5.0000 mg/kg | INTRAVENOUS | Status: AC
  Administered 2014-12-26: 390 mg via INTRAVENOUS
  Filled 2014-12-26: qty 9.75

## 2014-12-26 SURGICAL SUPPLY — 42 items
BAG URO CATCHER STRL LF (MISCELLANEOUS) ×2 IMPLANT
BANDAGE COBAN STERILE 2 (GAUZE/BANDAGES/DRESSINGS) ×2 IMPLANT
BLADE SURG 15 STRL LF DISP TIS (BLADE) ×1 IMPLANT
BLADE SURG 15 STRL SS (BLADE) ×2
BNDG CONFORM 2 STRL LF (GAUZE/BANDAGES/DRESSINGS) ×2 IMPLANT
CLOTH BEACON ORANGE TIMEOUT ST (SAFETY) ×2 IMPLANT
COVER BACK TABLE 60X90IN (DRAPES) ×2 IMPLANT
COVER MAYO STAND STRL (DRAPES) ×2 IMPLANT
DECANTER SPIKE VIAL GLASS SM (MISCELLANEOUS) IMPLANT
DRAPE PED LAPAROTOMY (DRAPES) ×2 IMPLANT
ELECT NDL TIP 2.8 STRL (NEEDLE) ×1 IMPLANT
ELECT NEEDLE TIP 2.8 STRL (NEEDLE) ×2 IMPLANT
ELECT REM PT RETURN 9FT ADLT (ELECTROSURGICAL) ×2
ELECTRODE REM PT RTRN 9FT ADLT (ELECTROSURGICAL) ×1 IMPLANT
GAUZE SPONGE 4X4 16PLY XRAY LF (GAUZE/BANDAGES/DRESSINGS) IMPLANT
GAUZE XEROFORM 1X8 LF (GAUZE/BANDAGES/DRESSINGS) ×2 IMPLANT
GLOVE BIO SURGEON STRL SZ7.5 (GLOVE) ×2 IMPLANT
GLOVE BIOGEL M 6.5 STRL (GLOVE) ×1 IMPLANT
GLOVE BIOGEL PI IND STRL 6.5 (GLOVE) IMPLANT
GLOVE BIOGEL PI IND STRL 7.5 (GLOVE) IMPLANT
GLOVE BIOGEL PI INDICATOR 6.5 (GLOVE) ×1
GLOVE BIOGEL PI INDICATOR 7.5 (GLOVE) ×1
GOWN STRL REUS W/ TWL LRG LVL3 (GOWN DISPOSABLE) ×3 IMPLANT
GOWN STRL REUS W/TWL LRG LVL3 (GOWN DISPOSABLE) ×4
KIT ROOM TURNOVER WOR (KITS) ×2 IMPLANT
MANIFOLD NEPTUNE II (INSTRUMENTS) IMPLANT
NDL HYPO 25X1 1.5 SAFETY (NEEDLE) IMPLANT
NEEDLE HYPO 25X1 1.5 SAFETY (NEEDLE) IMPLANT
NS IRRIG 500ML POUR BTL (IV SOLUTION) IMPLANT
PACK BASIN DAY SURGERY FS (CUSTOM PROCEDURE TRAY) ×2 IMPLANT
PACK CYSTO (CUSTOM PROCEDURE TRAY) ×2 IMPLANT
PENCIL BUTTON HOLSTER BLD 10FT (ELECTRODE) ×2 IMPLANT
SPONGE GAUZE 4X4 12PLY (GAUZE/BANDAGES/DRESSINGS) IMPLANT
SUT CHROMIC 4 0 PS 2 18 (SUTURE) IMPLANT
SYR CONTROL 10ML LL (SYRINGE) IMPLANT
TAPE SURG TRANSPORE 1 IN (GAUZE/BANDAGES/DRESSINGS) IMPLANT
TAPE SURGICAL TRANSPORE 1 IN (GAUZE/BANDAGES/DRESSINGS) ×1
TOWEL OR 17X26 10 PK STRL BLUE (TOWEL DISPOSABLE) ×2 IMPLANT
TRAY DSU PREP LF (CUSTOM PROCEDURE TRAY) ×2 IMPLANT
TUBE CONNECTING 12X1/4 (SUCTIONS) IMPLANT
WATER STERILE IRR 3000ML UROMA (IV SOLUTION) ×2 IMPLANT
WATER STERILE IRR 500ML POUR (IV SOLUTION) IMPLANT

## 2014-12-26 NOTE — Discharge Instructions (Signed)
1 - You may remove penile dressing tomorrow apply over the counter neosporin ointment to keep biopsy area moist. The dark scab like appearance of the biopsy site is normal as it heals.  2 - Call MD or go to ER for fever >102, severe pain / nausea / vomiting not relieved by medications, or acute change in medical status  CYSTOSCOPY HOME CARE INSTRUCTIONS  Activity: Rest for the remainder of the day.  Do not drive or operate equipment today.  You may resume normal activities in one to two days as instructed by your physician.   Meals: Drink plenty of liquids and eat light foods such as gelatin or soup this evening.  You may return to a normal meal plan tomorrow.  Return to Work: You may return to work in one to two days or as instructed by your physician.  Special Instructions / Symptoms: Call your physician if any of these symptoms occur:   -persistent or heavy bleeding  -bleeding which continues after first few urination  -large blood clots that are difficult to pass  -urine stream diminishes or stops completely  -fever equal to or higher than 101 degrees Farenheit.  -cloudy urine with a strong, foul odor  -severe pain  Females should always wipe from front to back after elimination.  You may feel some burning pain when you urinate.  This should disappear with time.  Applying moist heat to the lower abdomen or a hot tub bath may help relieve the pain. \  Follow-Up / Date of Return Visit to Your Physician:    Call for an appointment to arrange follow-up.  Patient Signature:  ________________________________________________________  Nurse's Signature:  ________________________________________________________  Call your surgeon if you experience:   1.  Fever over 101.0. 2.  Inability to urinate. 3.  Nausea and/or vomiting. 4.  Extreme swelling or bruising at the surgical site. 5.  Continued bleeding from the incision. 6.  Increased pain, redness or drainage from the  incision. 7.  Problems related to your pain medication. 8. Any change in color,  and/or sensation 9. Any problems and/or concerns    Post Anesthesia Home Care Instructions  Activity: Get plenty of rest for the remainder of the day. A responsible adult should stay with you for 24 hours following the procedure.  For the next 24 hours, DO NOT: -Drive a car -Paediatric nurse -Drink alcoholic beverages -Take any medication unless instructed by your physician -Make any legal decisions or sign important papers.  Meals: Start with liquid foods such as gelatin or soup. Progress to regular foods as tolerated. Avoid greasy, spicy, heavy foods. If nausea and/or vomiting occur, drink only clear liquids until the nausea and/or vomiting subsides. Call your physician if vomiting continues.  Special Instructions/Symptoms: Your throat may feel dry or sore from the anesthesia or the breathing tube placed in your throat during surgery. If this causes discomfort, gargle with warm salt water. The discomfort should disappear within 24 hours.  If you had a scopolamine patch placed behind your ear for the management of post- operative nausea and/or vomiting:  1. The medication in the patch is effective for 72 hours, after which it should be removed.  Wrap patch in a tissue and discard in the trash. Wash hands thoroughly with soap and water. 2. You may remove the patch earlier than 72 hours if you experience unpleasant side effects which may include dry mouth, dizziness or visual disturbances. 3. Avoid touching the patch. Wash your hands with soap and  patch. °  ° ° °

## 2014-12-26 NOTE — H&P (Signed)
Eric Cordova is an 64 y.o. male.    Chief Complaint: Pre=Op Penile Skin Biopsy and Cytoscopy  HPI:   1 - Balanitis / Penile Glans Irritation - pt treated by PCP with topical steroid / antifungal for presumed balanitis for symtpoms of burining / tingling / irritation of glans penis. Non-diabetic. Circumcised. Non-smoker. No chronic UV exposure. Exam w/o typical beefy erythema / exudate suggestive of balanitis, but more pathcy erythema more suggestive of dermatoses.   2 - Lower urinary tract symptoms, Urinary Urgency - Pt with progressive bother from irritative symptoms, he acually feels this morein penis / not in bladder. DRE 2016 60gm smooth. PVR 2016 10mL  PMH sig for TNA, bilat ing hernia repair, bilat hydrocelectomy, pilonidal cyst surgery. His PCP is Eric Ray MD with [facility] on [address].   Today " Eric Cordova " is seen to proceed with penile skin biopsy and cystoscopy. NO interval fevers.   Past Medical History  Diagnosis Date  . Hyperlipidemia   . Hypothyroidism   . Diverticulosis of colon   . History of diverticulitis of colon   . 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 Hernia repair  1960, 1974    inguinal bilateral  . Pilonidal cyst excision  1973  . Nasal septum surgery  1982  . Hydrocele excision Right 1984  . Ventral hernia repair  11/07/2011    Procedure: LAPAROSCOPIC VENTRAL HERNIA;  Surgeon: Gwenyth Ober, MD;  Location: [place];  Service: General;  Laterality: N/A;  . Insertion of mesh  11/07/2011    Procedure: INSERTION OF MESH;  Surgeon: Gwenyth Ober, MD;  Location: [place];  Service: General;  Laterality: N/A;  . Tonsillectomy  1958  . Laparoscopic sigmoid colectomy  11-12-2010    diverticulitis  . Inguinal hernia repair Bilateral 1960 & 1974    Family History  Problem Relation Age of Onset  . Cancer Mother     breast  . Thyroid disease Mother   . Asthma Mother   .  Cancer Father     prostate  . Dementia Father   . Heart disease Father   . Cancer Maternal Uncle     prostate  . Hypertension Brother   . Arthritis Maternal Grandmother    Social History:  reports that he has never smoked. He has never used smokeless tobacco. He reports that he drinks about 4.2 - 8.4 oz of alcohol per week. He reports that he does not use illicit drugs.  Allergies: No Known Allergies  No prescriptions prior to admission    No results found for this or any previous visit (from the past 48 hour(s)). No results found.  Review of Systems  Constitutional: Negative.   HENT: Negative.   Eyes: Negative.   Respiratory: Negative.   Cardiovascular: Negative.   Gastrointestinal: Negative.   Genitourinary: Negative.        Glans penis iritation (chornic)  Musculoskeletal: Negative.   Skin: Negative.   Neurological: Negative.   Endo/Heme/Allergies: Negative.   Psychiatric/Behavioral: Negative.     Height 5\' 7"  (1.702 m), weight 92.987 kg (205 lb). Physical Exam  Constitutional: He appears well-developed.  HENT:  Head: Normocephalic.  Eyes: Pupils are equal, round, and reactive to light.  Neck: Normal range of motion.  Cardiovascular: Normal rate.   Respiratory: Effort normal.  GI: Soft.  Genitourinary:  persistent motteled appearance with some areas of erythema and  palor of glans. No exudate.   Musculoskeletal: Normal range of motion.  Neurological: He is alert.  Skin: Skin is warm.  Psychiatric: He has a normal mood and affect. His behavior is normal. Judgment and thought content normal.     Assessment/Plan  1 - Balanitis / Glans Penis Irritation - very low suspicion for fungal balanitis by history and exam. Also low suspicion for squamous cell / CIS, though possible. Appearance more c/w dermatologic problem (lichen planus or other).   PT very concerned. Rec shave biopsy of glans skin with goal of r/o carcinoma in situ and perhaps rule in primary  dermatologic issue. Risks, benefits, peri-op course, alternatives (including non-treatment) also discussed. Re-discussed he would have eschar aftewards that would heal.    2 - Lower urinary tract symptoms - pt actually localizes symptoms to glans penis, not bladder. Will perform flex cysto at time of BX above to r/o stricutre / urethral infolvement.  Proceed today as planned.   Eric Cordova 12/26/2014, 6:10 AM

## 2014-12-26 NOTE — Brief Op Note (Signed)
12/26/2014  12:11 PM  PATIENT:  Eric Cordova  64 y.o. male  PRE-OPERATIVE DIAGNOSIS:  GLANS PENIS IRRITATION / LESION  POST-OPERATIVE DIAGNOSIS:  GLANS PENIS IRRITATION / LESION  PROCEDURE:  Procedure(s): PENILE  SKIN BIOPSY WITH PENILE BLOCK (N/A) CYSTOSCOPY (N/A)  SURGEON:  Surgeon(s) and Role:    * Alexis Frock, MD - Primary  PHYSICIAN ASSISTANT:   ASSISTANTS: none   ANESTHESIA:   general  EBL:     BLOOD ADMINISTERED:none  DRAINS: none   LOCAL MEDICATIONS USED:  MARCAINE     SPECIMEN:  Source of Specimen:  glans penis skin  DISPOSITION OF SPECIMEN:  PATHOLOGY  COUNTS:  YES  TOURNIQUET:  * No tourniquets in log *  DICTATION: .Other Dictation: Dictation Number [id number]  PLAN OF CARE: Discharge to home after PACU  PATIENT DISPOSITION:  PACU - hemodynamically stable.   Delay start of Pharmacological VTE agent (>24hrs) due to surgical blood loss or risk of bleeding: not applicable

## 2014-12-26 NOTE — Anesthesia Preprocedure Evaluation (Addendum)
Anesthesia Evaluation  Patient identified by MRN, date of birth, ID band Patient awake    Reviewed: Allergy & Precautions, NPO status , Patient's Chart, lab work & pertinent test results  Airway Mallampati: II  TM Distance: >3 FB Neck ROM: Full    Dental no notable dental hx.    Pulmonary asthma ,    Pulmonary exam normal breath sounds clear to auscultation       Cardiovascular Exercise Tolerance: Good hypertension, Normal cardiovascular exam Rhythm:Regular Rate:Normal     Neuro/Psych negative neurological ROS  negative psych ROS   GI/Hepatic negative GI ROS, Neg liver ROS,   Endo/Other  Hypothyroidism   Renal/GU negative Renal ROS  negative genitourinary   Musculoskeletal  (+) Arthritis ,   Abdominal (+) + obese,   Peds negative pediatric ROS (+)  Hematology negative hematology ROS (+)   Anesthesia Other Findings   Reproductive/Obstetrics negative OB ROS                             Anesthesia Physical Anesthesia Plan  ASA: II  Anesthesia Plan: General   Post-op Pain Management:    Induction: Intravenous  Airway Management Planned: LMA  Additional Equipment:   Intra-op Plan:   Post-operative Plan: Extubation in OR  Informed Consent: I have reviewed the patients History and Physical, chart, labs and discussed the procedure including the risks, benefits and alternatives for the proposed anesthesia with the patient or authorized representative who has indicated his/her understanding and acceptance.   Dental advisory given  Plan Discussed with: CRNA  Anesthesia Plan Comments:         Anesthesia Quick Evaluation

## 2014-12-26 NOTE — Transfer of Care (Signed)
  Immediate Anesthesia Transfer of Care Note  Patient: Eric Cordova  Procedure(s) Performed: Procedure(s) (LRB): PENILE  SKIN BIOPSY WITH PENILE BLOCK (N/A) CYSTOSCOPY (N/A)  Patient Location: PACU  Anesthesia Type: General  Level of Consciousness: awake, alert  and oriented  Airway & Oxygen Therapy: Patient Spontanous Breathing and Patient connected to face mask oxygen  Post-op Assessment: Report given to PACU RN and Post -op Vital signs reviewed and stable  Post vital signs: Reviewed and stable  Complications: No apparent anesthesia complications Last Vitals:  Filed Vitals:   12/26/14 1013  BP: 134/77  Pulse: 53  Temp: 36.3 C  Resp: 18

## 2014-12-27 NOTE — Anesthesia Postprocedure Evaluation (Signed)
Anesthesia Post Note  Patient: Eric Cordova  Procedure(s) Performed: Procedure(s) (LRB): PENILE  SKIN BIOPSY WITH PENILE BLOCK (N/A) CYSTOSCOPY (N/A)  Patient location during evaluation: PACU Anesthesia Type: General Level of consciousness: awake and alert Pain management: pain level controlled Vital Signs Assessment: post-procedure vital signs reviewed and stable Respiratory status: spontaneous breathing, nonlabored ventilation, respiratory function stable and patient connected to nasal cannula oxygen Cardiovascular status: blood pressure returned to baseline and stable Postop Assessment: no signs of nausea or vomiting Anesthetic complications: no    Last Vitals:  Filed Vitals:   12/26/14 1330 12/26/14 1419  BP: 128/81 134/70  Pulse: 47 52  Temp:  36.7 C  Resp: 10 16    Last Pain: There were no vitals filed for this visit.               Masson Nalepa J

## 2014-12-29 ENCOUNTER — Encounter (HOSPITAL_BASED_OUTPATIENT_CLINIC_OR_DEPARTMENT_OTHER): Payer: Self-pay | Admitting: Urology

## 2014-12-29 NOTE — Op Note (Signed)
Eric Cordova, TROEGER          ACCOUNT NO.:  [id number]  MEDICAL RECORD NO.:  [id number]  LOCATION:                               FACILITY[facility]  [facility]  PHYSICIAN:  Alexis Frock, MD     DATE OF BIRTH:  1950-05-07  DATE OF PROCEDURE:  12/26/2014                              OPERATIVE REPORT  DIAGNOSES:  Chronic glans penis, penile irritation and irritative voiding symptoms.  PROCEDURES: 1. Cystourethroscopy. 2. Penile biopsy, area less than 1 cm. 3. Penile block.  ESTIMATED BLOOD LOSS:  Nil.  COMPLICATIONS:  None.  SPECIMEN:  Penile glans shave skin biopsy for permanent pathology.  FINDINGS: 1. Unremarkable urethra, bladder prostate, very modest trilobar     prostatic hypertrophy without trabeculation. 2. Continued chronic mottled appearance of glans penis grossly without     exudate or mass lesions.  INDICATION:  Eric Cordova is a very pleasant 64 year old gentleman with several-month history of progressive and severe irritation of his glans penis.  He has been treated empirically for presumed fungal balanitis, but has had persistent symptoms and he carries no significant risk factors for fungal balanitis.  His gross exam appears to be more consistent with likely dermatologic diagnosis such as lichen planus or similar and options were discussed for further management including continued trial of topical therapies versus penile biopsy with the goal especially ruling out any sort of carcinoma in situ and he wished to proceed.  Given his irritative voiding symptoms, we also discussed the role of cystourethroscopy to rule out stricture disease and he also wished to proceed.  Informed consent was obtained and placed in the medical record.  PROCEDURE IN DETAIL:  The patient being Eric Cordova, was verified.  Procedure being cysto and penile biopsy was confirmed. Procedure was carried out.  Time-out was performed.  Intravenous antibiotics were administered.  General  LMA anesthesia was introduced. The patient was placed in the supine position and sterile field was created by prepping and draping his penis, perineum and proximal thighs using iodine x3.  Cystourethroscopy was performed using a 16-French flexible cystoscope.  This revealed unremarkable pendulous urethra.  The prostatic urethra revealed modest trilobar hypertrophy.  Inspection of the urinary bladder revealed no diverticula, calcifications, papillary lesions.  Bladder was nontrabeculated.  There was a very modest median lobe.  Ureteral orifices appeared singleton bilaterally.  No additional findings were seen on retroflexion.  The scope was then withdrawn. Attention was the directed to the penile biopsy.  Shave biopsy was performed of an ellipse of tissue approximately 4 x 8 mm at the 12 o'clock position of glans penis approximately 1-2 mm in depth incorporating the obviously grossly mottled area.  This was set aside for permanent pathology.  The base of this was fulgurated with Bovie cautery, resulted in excellent hemostasis.  A dressing of Xeroform gauze and loose Kling was then applied.  After penile block was performed using 10 mL of 0.25% plain Marcaine, 5 mL in a ring block-type fashion at the base of the penis and 5 mL directed at the inferior pubic ramus penile nerve area after avoided intravascular injection.  Procedure was then terminated.  The patient tolerated the procedure well.  There were no immediate periprocedural complications.  The patient was taken to the postanesthesia care unit in stable condition.          ______________________________ Alexis Frock, MD     TM/MEDQ  D:  12/26/2014  T:  12/27/2014  Job:  [id number]

## 2015-02-09 ENCOUNTER — Telehealth: Payer: Self-pay

## 2015-02-09 NOTE — Telephone Encounter (Signed)
[facility] calling for refill on gemfibrozil (LOPID) 600 MG tablet Sent the request over on Friday.   [phone]

## 2015-02-10 NOTE — Telephone Encounter (Signed)
We have never seen pt for general check up or labs. We would need to see him first and do lipid panel. Notified pharm.

## 2015-02-10 NOTE — Telephone Encounter (Signed)
Spoke with patient told her to come to clinic

## 2015-02-11 ENCOUNTER — Ambulatory Visit (INDEPENDENT_AMBULATORY_CARE_PROVIDER_SITE_OTHER): Payer: BLUE CROSS/BLUE SHIELD | Admitting: Family Medicine

## 2015-02-11 VITALS — BP 124/80 | HR 55 | Temp 97.7°F | Resp 17 | Ht 67.0 in | Wt 220.0 lb

## 2015-02-11 DIAGNOSIS — E039 Hypothyroidism, unspecified: Secondary | ICD-10-CM

## 2015-02-11 DIAGNOSIS — E785 Hyperlipidemia, unspecified: Secondary | ICD-10-CM

## 2015-02-11 MED ORDER — GEMFIBROZIL 600 MG PO TABS: 600.0000 mg | ORAL_TABLET | Freq: Two times a day (BID) | ORAL | Status: DC

## 2015-02-11 NOTE — Progress Notes (Signed)
Chief Complaint:  Chief Complaint  Patient presents with  . Medication Refill    gemfibrozil    HPI: Eric Cordova is a 65 y.o. male who reports to [facility] today complaining of  HLD and also hypothyroidsim. Dr Nyoka Cowden is his PCP.  He has had a terrible couple days, he lost his 41 y.o dog, he is afraid of needles, he is a [employer].  He did not drink anything would like to come back for labs. He just cannot think about gettting labs done today.    Past Medical History  Diagnosis Date  . Hyperlipidemia   . Hypothyroidism   . Diverticulosis of colon   . History of diverticulitis of colon   . 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 Hernia repair  1960, 1974    inguinal bilateral  . Pilonidal cyst excision  1973  . Nasal septum surgery  1982  . Hydrocele excision Right 1984  . Ventral hernia repair  11/07/2011    Procedure: LAPAROSCOPIC VENTRAL HERNIA;  Surgeon: Gwenyth Ober, MD;  Location: [place];  Service: General;  Laterality: N/A;  . Insertion of mesh  11/07/2011    Procedure: INSERTION OF MESH;  Surgeon: Gwenyth Ober, MD;  Location: [place];  Service: General;  Laterality: N/A;  . Tonsillectomy  1958  . Laparoscopic sigmoid colectomy  11-12-2010    diverticulitis  . Inguinal hernia repair Bilateral 1960 & 1974  . Penile biopsy N/A 12/26/2014    Procedure: PENILE  SKIN BIOPSY WITH PENILE BLOCK;  Surgeon: Alexis Frock, MD;  Location: [facility];  Service: Urology;  Laterality: N/A;  . Cystoscopy N/A 12/26/2014    Procedure: CYSTOSCOPY;  Surgeon: Alexis Frock, MD;  Location: [facility];  Service: Urology;  Laterality: N/A;   Social History   Social History  . Marital Status: Married    Spouse Name: N/A  . Number of Children: N/A  . Years of Education: N/A   Social History Main Topics  . Smoking status: Never Smoker   .  Smokeless tobacco: Never Used  . Alcohol Use: 4.2 - 8.4 oz/week    7-14 Shots of liquor per week     Comment: 1 to 2  shots daily  . Drug Use: No  . Sexual Activity: Not Asked   Other Topics Concern  . None   Social History Narrative   Family History  Problem Relation Age of Onset  . Cancer Mother     breast  . Thyroid disease Mother   . Asthma Mother   . Cancer Father     prostate  . Dementia Father   . Heart disease Father   . Cancer Maternal Uncle     prostate  . Hypertension Brother   . Arthritis Maternal Grandmother    No Known Allergies Prior to Admission medications   Medication Sig Start Date End Date Taking? Authorizing Provider  Cholecalciferol (VITAMIN D-3 PO) Take 2,000 mg by mouth daily.     Yes Historical Provider, MD  gemfibrozil (LOPID) 600 MG tablet Take 600 mg by mouth 2 (two) times daily before a meal.   Yes Historical Provider, MD  levothyroxine (SYNTHROID, LEVOTHROID) 50 MCG tablet Take 50-75 mcg by mouth daily before breakfast. 1.5 tablet on Monday, Wednesday and Friday; 1 tablet all other days of the week   Yes Historical  Provider, MD     ROS: The patient denies fevers, chills, night sweats, unintentional weight loss, chest pain, palpitations, wheezing, dyspnea on exertion, nausea, vomiting, abdominal pain, dysuria, hematuria, melena, numbness, weakness, or tingling.  All other systems have been reviewed and were otherwise negative with the exception of those mentioned in the HPI and as above.    PHYSICAL EXAM: Filed Vitals:   02/11/15 1533  BP: 124/80  Pulse: 55  Temp: 97.7 F (36.5 C)  Resp: 17   Body mass index is 34.45 kg/(m^2).   General: Alert, no acute distress HEENT:  Normocephalic, atraumatic, oropharynx patent. EOMI, PERRLA Cardiovascular:  Regular rate and rhythm, no rubs murmurs or gallops.  No Carotid bruits, radial pulse intact. No pedal edema.  Respiratory: Clear to auscultation bilaterally.  No wheezes, rales, or rhonchi.   No cyanosis, no use of accessory musculature Abdominal: No organomegaly, abdomen is soft and non-tender, positive bowel sounds. No masses. Skin: No rashes. Neurologic: Facial musculature symmetric. Psychiatric: Patient acts appropriately throughout our interaction. Lymphatic: No cervical or submandibular lymphadenopathy Musculoskeletal: Gait intact. No edema, tenderness   LABS: Results for orders placed or performed during the hospital encounter of 12/26/14  I-STAT, chem 8  Result Value Ref Range   Sodium 141 135 - 145 mmol/L   Potassium 4.3 3.5 - 5.1 mmol/L   Chloride 105 101 - 111 mmol/L   BUN 18 6 - 20 mg/dL   Creatinine, Ser 0.90 0.61 - 1.24 mg/dL   Glucose, Bld 112 (H) 65 - 99 mg/dL   Calcium, Ion 1.22 1.13 - 1.30 mmol/L   TCO2 25 0 - 100 mmol/L   Hemoglobin 16.0 13.0 - 17.0 g/dL   HCT 47.0 39.0 - 52.0 %     EKG/XRAY:   Primary read interpreted by Dr. Marin Comment at [facility].   ASSESSMENT/PLAN: Encounter Diagnoses  Name Primary?  . Hyperlipidemia Yes  . Hypothyroidism, unspecified hypothyroidism type     Will return for fastinglabs in AM  Fu prn otherwise 6 montsh to 1 year. Medication refills completed today.   Gross sideeffects, risk and benefits, and alternatives of medications d/w patient. Patient is aware that all medications have potential sideeffects and we are unable to predict every sideeffect or drug-drug interaction that may occur.  Edison Wollschlager DO  02/11/2015 4:42 PM

## 2015-02-12 ENCOUNTER — Other Ambulatory Visit (INDEPENDENT_AMBULATORY_CARE_PROVIDER_SITE_OTHER): Payer: BLUE CROSS/BLUE SHIELD

## 2015-02-12 DIAGNOSIS — E039 Hypothyroidism, unspecified: Secondary | ICD-10-CM

## 2015-02-12 DIAGNOSIS — E785 Hyperlipidemia, unspecified: Secondary | ICD-10-CM | POA: Diagnosis not present

## 2015-02-12 LAB — COMPLETE METABOLIC PANEL WITH GFR
AST: 19 U/L (ref 10–35)
Albumin: 4.2 g/dL (ref 3.6–5.1)
Alkaline Phosphatase: 54 U/L (ref 40–115)
BUN: 20 mg/dL (ref 7–25)
CO2: 25 mmol/L (ref 20–31)
Glucose, Bld: 106 mg/dL — ABNORMAL HIGH (ref 65–99)
Sodium: 135 mmol/L (ref 135–146)
Total Bilirubin: 0.5 mg/dL (ref 0.2–1.2)
Total Protein: 6.9 g/dL (ref 6.1–8.1)

## 2015-02-12 LAB — LIPID PANEL
Cholesterol: 287 mg/dL — ABNORMAL HIGH (ref 125–200)
HDL: 67 mg/dL (ref >=40)
LDL Cholesterol: 196 mg/dL — ABNORMAL HIGH (ref <130)
Total CHOL/HDL Ratio: 4.3 ratio (ref <=5.0)
Triglycerides: 120 mg/dL (ref <150)
VLDL: 24 mg/dL (ref <30)

## 2015-02-12 LAB — COMPLETE METABOLIC PANEL WITHOUT GFR
ALT: 13 U/L (ref 9–46)
Calcium: 9.4 mg/dL (ref 8.6–10.3)
Chloride: 100 mmol/L (ref 98–110)
Creat: 0.9 mg/dL (ref 0.70–1.25)
GFR, Est African American: >89 mL/min (ref >=60)
GFR, Est Non African American: >89 mL/min (ref >=60)
Potassium: 4.5 mmol/L (ref 3.5–5.3)

## 2015-02-12 LAB — TSH: TSH: 2.693 u[IU]/mL (ref 0.350–4.500)

## 2015-02-16 DIAGNOSIS — L900 Lichen sclerosus et atrophicus: Principal | ICD-10-CM

## 2015-02-16 DIAGNOSIS — D074 Carcinoma in situ of penis: Secondary | ICD-10-CM

## 2015-02-16 DIAGNOSIS — N480 Leukoplakia of penis: Secondary | ICD-10-CM | POA: Insufficient documentation

## 2015-02-18 ENCOUNTER — Encounter: Payer: Self-pay | Admitting: Family Medicine

## 2015-02-18 ENCOUNTER — Telehealth: Payer: Self-pay | Admitting: Family Medicine

## 2015-02-18 NOTE — Telephone Encounter (Signed)
Spoke with patient about labs, no change.

## 2015-08-03 ENCOUNTER — Ambulatory Visit (INDEPENDENT_AMBULATORY_CARE_PROVIDER_SITE_OTHER): Payer: BLUE CROSS/BLUE SHIELD | Admitting: Physician Assistant

## 2015-08-03 VITALS — BP 118/72 | HR 56 | Temp 98.0°F | Resp 18 | Ht 67.0 in | Wt 208.8 lb

## 2015-08-03 DIAGNOSIS — M79671 Pain in right foot: Secondary | ICD-10-CM

## 2015-08-03 DIAGNOSIS — S91301A Unspecified open wound, right foot, initial encounter: Secondary | ICD-10-CM | POA: Diagnosis not present

## 2015-08-03 DIAGNOSIS — Z23 Encounter for immunization: Secondary | ICD-10-CM

## 2015-08-03 NOTE — Progress Notes (Signed)
Eric Cordova  MRN: [id number] DOB: 1950-06-02  Subjective:  Eric Cordova is a 65 y.o. male seen in office today for a chief complaint of glass in foot. Stepped on broken wine glass three nights ago. Noticed bleeding from the area immediately after but did not see any glass shards.  Wife cleaned the area and put neosporin on it. Pt states that the area has gotten progressively more painful over the past two days and is concerned that he feels a piece of glass in his heel.  Denies redness, swelling, warmth, and fever. Pt does not know the last time he had tetanus.   Review of Systems  Musculoskeletal: Positive for gait problem (hard for patient to bear full weight on right foot due to pain).  All other systems reviewed and are negative.   Patient Active Problem List   Diagnosis Date Noted  . Lichen sclerosus-associated penile intraepithelial neoplasia 02/16/2015  . Unspecified vitamin D deficiency 12/03/2012  . Hyperlipidemia   . Hypothyroidism   . Allergy   . Degenerative joint disease   . Asthma   . Hypertension   . Prediabetes   . Incisional hernia 08/16/2011  . Postop check 11/30/2010  . Diverticulosis of colon 10/19/2010  . ABDOMINAL PAIN, GENERALIZED 03/04/2009  . DIARRHEA-PRESUMED INFECTIOUS 02/19/2009  . DIVERTICULITIS-COLON 02/19/2009  . ABNORMAL FINDINGS GI TRACT 02/19/2009    Current Outpatient Prescriptions on File Prior to Visit  Medication Sig Dispense Refill  . Cholecalciferol (VITAMIN D-3 PO) Take 2,000 mg by mouth daily.      Marland Kitchen gemfibrozil (LOPID) 600 MG tablet Take 1 tablet (600 mg total) by mouth 2 (two) times daily before a meal. This is the correct prescription 180 tablet 3  . levothyroxine (SYNTHROID, LEVOTHROID) 50 MCG tablet Take 50-75 mcg by mouth daily before breakfast. 1.5 tablet on Monday, Wednesday and Friday; 1 tablet all other days of the week     No current facility-administered medications on file prior to visit.     No Known  Allergies   Social History   Social History  . Marital status: Married    Spouse name: N/A  . Number of children: N/A  . Years of education: N/A   Occupational History  . Not on file.   Social History Main Topics  . Smoking status: Never Smoker  . Smokeless tobacco: Never Used  . Alcohol use 4.2 - 8.4 oz/week    7 - 14 Shots of liquor per week     Comment: 1 to 2  shots daily  . Drug use: No  . Sexual activity: Not on file   Other Topics Concern  . Not on file   Social History Narrative  . No narrative on file     Objective:  BP 118/72   Pulse (!) 56   Temp 98 F (36.7 C) (Oral)   Resp 18   Ht 5\' 7"  (1.702 m)   Wt 208 lb 12.8 oz (94.7 kg)   SpO2 97%   BMI 32.70 kg/m   Physical Exam  Constitutional: He is oriented to person, place, and time and well-developed, well-nourished, and in no distress.  HENT:  Head: Normocephalic and atraumatic.  Eyes: Conjunctivae are normal.  Neck: Normal range of motion.  Pulmonary/Chest: Effort normal.  Neurological: He is alert and oriented to person, place, and time. He has normal sensation and normal strength.  Skin: Skin is warm and dry.  2 mm open wound noted on heel of right foot, covered with  dried blood.   No erythema or warmth.  No foreign object visualized. Hard center noted on palpation.   Psychiatric: Affect normal.  Vitals reviewed.  Foreign Body Removal Procedure:  Affected area cleansed with alcohol swab. Local anesthesia of 1cc of 1% lidocaine with epinephrine used. #11 blade scapel used to make a 0.5 cm x-shaped incision over wound. During incision, foreign body felt, though never identified. Incision explored further for foreign body with needle nosed forceps. Wound cleansed and bandaid applied. Patient tolerated well. Wound care instructions given.    .Assessment and Plan :   1. Open wound of right heel, initial encounter - Tdap vaccine greater than or equal to 7yo IM -Wound care instructions given    Tenna Delaine PA-C  [facility] 08/03/2015 1:03 PM

## 2015-08-03 NOTE — Patient Instructions (Addendum)
  Soak your foot in warm clean water 2-3 times a day for 15-20 minutes. Use corn pads on affected area for cushioning support.   Return to clinic if symptoms worsen, do not improve, or as needed  Thank you for letting me participate in your health and well being.   IF you received an x-ray today, you will receive an invoice from [facility]. Please contact [facility] at [phone] with questions or concerns regarding your invoice.   IF you received labwork today, you will receive an invoice from [employer]. Please contact [facility] at [phone] with questions or concerns regarding your invoice.   Our billing staff will not be able to assist you with questions regarding bills from these companies.  You will be contacted with the lab results as soon as they are available. The fastest way to get your results is to activate your My Chart account. Instructions are located on the last page of this paperwork. If you have not heard from [place] regarding the results in 2 weeks, please contact this office.

## 2016-11-25 NOTE — Telephone Encounter (Signed)
done

## 2017-08-03 DIAGNOSIS — F5101 Primary insomnia: Secondary | ICD-10-CM | POA: Insufficient documentation

## 2017-08-03 DIAGNOSIS — J45909 Unspecified asthma, uncomplicated: Secondary | ICD-10-CM | POA: Insufficient documentation

## 2017-08-03 DIAGNOSIS — R7303 Prediabetes: Secondary | ICD-10-CM | POA: Insufficient documentation

## 2017-08-03 DIAGNOSIS — I10 Essential (primary) hypertension: Secondary | ICD-10-CM | POA: Insufficient documentation

## 2017-08-03 DIAGNOSIS — M1990 Unspecified osteoarthritis, unspecified site: Secondary | ICD-10-CM | POA: Insufficient documentation

## 2017-08-03 DIAGNOSIS — E039 Hypothyroidism, unspecified: Secondary | ICD-10-CM | POA: Insufficient documentation

## 2017-08-03 DIAGNOSIS — T7840XA Allergy, unspecified, initial encounter: Secondary | ICD-10-CM | POA: Insufficient documentation

## 2017-08-03 DIAGNOSIS — E785 Hyperlipidemia, unspecified: Secondary | ICD-10-CM | POA: Insufficient documentation

## 2018-02-14 DIAGNOSIS — L219 Seborrheic dermatitis, unspecified: Secondary | ICD-10-CM | POA: Insufficient documentation

## 2018-03-04 ENCOUNTER — Encounter (HOSPITAL_COMMUNITY): Payer: Self-pay

## 2018-03-04 ENCOUNTER — Ambulatory Visit (INDEPENDENT_AMBULATORY_CARE_PROVIDER_SITE_OTHER): Payer: Medicare Other

## 2018-03-04 ENCOUNTER — Ambulatory Visit (HOSPITAL_COMMUNITY)
Admission: EM | Admit: 2018-03-04 | Discharge: 2018-03-04 | Disposition: A | Discharge status: Home / Self Care | Payer: Medicare Other | Attending: Emergency Medicine | Admitting: Emergency Medicine

## 2018-03-04 DIAGNOSIS — R69 Illness, unspecified: Secondary | ICD-10-CM | POA: Diagnosis not present

## 2018-03-04 DIAGNOSIS — R05 Cough: Secondary | ICD-10-CM | POA: Diagnosis not present

## 2018-03-04 DIAGNOSIS — J111 Influenza due to unidentified influenza virus with other respiratory manifestations: Secondary | ICD-10-CM

## 2018-03-04 DIAGNOSIS — R0981 Nasal congestion: Secondary | ICD-10-CM

## 2018-03-04 MED ORDER — ONDANSETRON HCL 4 MG PO TABS: 4.0000 mg | ORAL_TABLET | Freq: Three times a day (TID) | ORAL | 0 refills | Status: DC | PRN

## 2018-03-04 MED ORDER — OSELTAMIVIR PHOSPHATE 75 MG PO CAPS: 75.0000 mg | ORAL_CAPSULE | Freq: Two times a day (BID) | ORAL | 0 refills | Status: AC

## 2018-03-04 MED ORDER — BENZONATATE 100 MG PO CAPS: 100.0000 mg | ORAL_CAPSULE | Freq: Three times a day (TID) | ORAL | 0 refills | Status: DC

## 2018-03-04 NOTE — ED Provider Notes (Signed)
[place]    CSN: [id number] Arrival date & time: 03/04/18  1326     History   Chief Complaint Chief Complaint  Patient presents with  . Cough  . Chills  . Shortness of Breath    HPI Oscar Deery is a 68 y.o. male.   Kayl presents with his wife with complaints of cough and chills. He developed a cough approximately two days ago, fevers, body aches, headache congestion since last night. States he has had some mild sore throat and ear pain for a few weeks. Has tried zyrtec and delsym which have helped some with his symptoms. Has taken tylenol which has helped with fever. He feels shortness of breath with his cough. No chest pain . Had asthma as a child, no longer with inhaler. No gi/gu complaints. His neighbors have illness. Did not get a flu vaccine. Hx of hyperlipidemia, hypothyroidism, prediabetes.    ROS per HPI.      Past Medical History:  Diagnosis Date  . Diverticulosis of colon   . History of diverticulitis of colon   . Hyperlipidemia   . Hypothyroidism   . 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 Lichen sclerosus-associated penile intraepithelial neoplasia 02/16/2015  . Unspecified vitamin D deficiency 12/03/2012  . Hyperlipidemia   . Hypothyroidism   . Allergy   . Degenerative joint disease   . Asthma   . Hypertension   . Prediabetes   . Incisional hernia 08/16/2011  . Postop check 11/30/2010  . Diverticulosis of colon 10/19/2010  . ABDOMINAL PAIN, GENERALIZED 03/04/2009  . DIARRHEA-PRESUMED INFECTIOUS 02/19/2009  . DIVERTICULITIS-COLON 02/19/2009  . ABNORMAL FINDINGS GI TRACT 02/19/2009    Past Surgical History:  Procedure Laterality Date  . CYSTOSCOPY N/A 12/26/2014   Procedure: CYSTOSCOPY;  Surgeon: Alexis Frock, MD;  Location: [facility];  Service: Urology;  Laterality: N/A;  . [place]   inguinal bilateral   . HYDROCELE EXCISION Right 1984  . INGUINAL HERNIA REPAIR Bilateral 1960 & 1974  . INSERTION OF MESH  11/07/2011   Procedure: INSERTION OF MESH;  Surgeon: Gwenyth Ober, MD;  Location: [facility];  Service: General;  Laterality: N/A;  . LAPAROSCOPIC SIGMOID COLECTOMY  11-12-2010   diverticulitis  . NASAL SEPTUM SURGERY  1982  . PENILE BIOPSY N/A 12/26/2014   Procedure: PENILE  SKIN BIOPSY WITH PENILE BLOCK;  Surgeon: Alexis Frock, MD;  Location: [facility];  Service: Urology;  Laterality: N/A;  . PILONIDAL CYST EXCISION  1973  . TONSILLECTOMY  1958  . VENTRAL HERNIA REPAIR  11/07/2011   Procedure: LAPAROSCOPIC VENTRAL HERNIA;  Surgeon: Gwenyth Ober, MD;  Location: [facility];  Service: General;  Laterality: N/A;       Home Medications    Prior to Admission medications   Medication Sig Start Date End Date Taking? Authorizing Provider  benzonatate (TESSALON) 100 MG capsule Take 1 capsule (100 mg total) by mouth every 8 (eight) hours. 03/04/18   Zigmund Gottron, NP  Cholecalciferol (VITAMIN D-3 PO) Take 2,000 mg by mouth daily.      [provider]  gemfibrozil (LOPID) 600 MG tablet Take 1 tablet (600 mg total) by mouth 2 (two) times daily before a meal. This is the correct prescription 02/11/15   Le, Thao P, DO  levothyroxine (SYNTHROID, LEVOTHROID) 50 MCG tablet Take 50-75 mcg by  mouth daily before breakfast. 1.5 tablet on Monday, Wednesday and Friday; 1 tablet all other days of the week    [provider]  ondansetron (ZOFRAN) 4 MG tablet Take 1 tablet (4 mg total) by mouth every 8 (eight) hours as needed for nausea or vomiting. 03/04/18   Zigmund Gottron, NP  oseltamivir (TAMIFLU) 75 MG capsule Take 1 capsule (75 mg total) by mouth every 12 (twelve) hours for 5 days. 03/04/18 03/09/18  Augusto Gamble B, NP  triamcinolone cream (KENALOG) 0.5 % Apply 1 application topically 3 (three) times daily.    [provider]    Family History Family History    Problem Relation Age of Onset  . Cancer Mother        breast  . Thyroid disease Mother   . Asthma Mother   . Cancer Father        prostate  . Dementia Father   . Heart disease Father   . Cancer Maternal Uncle        prostate  . Hypertension Brother   . Arthritis Maternal Grandmother     Social History Social History   Tobacco Use  . Smoking status: Never Smoker  . Smokeless tobacco: Never Used  Substance Use Topics  . Alcohol use: Yes    Alcohol/week: 7.0 - 14.0 standard drinks    Types: 7 - 14 Shots of liquor per week    Comment: 1 to 2  shots daily  . Drug use: No     Allergies   Patient has no known allergies.   Review of Systems Review of Systems   Physical Exam Triage Vital Signs ED Triage Vitals  Enc Vitals Group     BP 03/04/18 1434 127/67     Pulse Rate 03/04/18 1434 70     Resp 03/04/18 1434 16     Temp 03/04/18 1434 98.4 F (36.9 C)     Temp Source 03/04/18 1434 Oral     SpO2 03/04/18 1434 98 %     Weight --      Height --      Head Circumference --      Peak Flow --      Pain Score 03/04/18 1436 7     Pain Loc --      Pain Edu? --      Excl. in [place]? --    No data found.  Updated Vital Signs BP 127/67 (BP Location: Right Arm)   Pulse 70   Temp 98.4 F (36.9 C) (Oral)   Resp 16   SpO2 98%    Physical Exam Vitals signs reviewed.  Constitutional:      Appearance: He is well-developed. He is ill-appearing.  HENT:     Head: Normocephalic and atraumatic.     Right Ear: Tympanic membrane, ear canal and external ear normal.     Left Ear: Tympanic membrane, ear canal and external ear normal.     Nose: Nose normal.     Right Sinus: No maxillary sinus tenderness or frontal sinus tenderness.     Left Sinus: No maxillary sinus tenderness or frontal sinus tenderness.     Mouth/Throat:     Pharynx: Uvula midline.  Eyes:     Conjunctiva/sclera: Conjunctivae normal.     Pupils: Pupils are equal, round, and reactive to light.  Neck:      Musculoskeletal: Normal range of motion.  Cardiovascular:     Rate and Rhythm: Normal rate and regular rhythm.  Pulmonary:     Effort: Pulmonary effort is normal.     Breath sounds: Normal breath sounds.  Lymphadenopathy:     Cervical: No cervical adenopathy.  Skin:    General: Skin is warm and dry.  Neurological:     Mental Status: He is alert and oriented to person, place, and time.      UC Treatments / Results  Labs (all labs ordered are listed, but only abnormal results are displayed) Labs Reviewed - No data to display  EKG None  Radiology Dg Chest 2 View  Result Date: 03/04/2018 CLINICAL DATA:  Persistent cough congestion. EXAM: CHEST - 2 VIEW COMPARISON:  03/29/2010 FINDINGS: Cardiomediastinal silhouette is normal. Mediastinal contours appear intact. Calcific atherosclerotic disease and tortuosity of the aorta. Chronic eventration of the right hemidiaphragm. There is no evidence of focal airspace consolidation, pleural effusion or pneumothorax. Osseous structures are without acute abnormality. Soft tissues are grossly normal. IMPRESSION: No active cardiopulmonary disease. Electronically Signed   By: Fidela Salisbury M.D.   On: 03/04/2018 15:19    Procedures Procedures (including critical care time)  Medications Ordered in UC Medications - No data to display  Initial Impression / Assessment and Plan / UC Course  I have reviewed the triage vital signs and the nursing notes.  Pertinent labs & imaging results that were available during my care of the patient were reviewed by me and considered in my medical decision making (see chart for details).     Non toxic in appearance, no increased work of breathing. Endorses rigors last night, headache, body aches cough. Chest xray reassuring. Concern for influenza with tamiflu provided as >65 and with htn. Strict return precautions discussed. Patient verbalized understanding and agreeable to plan.  Ambulatory out of clinic  without difficulty.   Final Clinical Impressions(s) / UC Diagnoses   Final diagnoses:  Influenza-like illness     Discharge Instructions     Chest xray is reassuring today.  May complete course of tamiflu. May cause stomach upset, may use zofran as needed for nausea.  Push fluids to ensure adequate hydration and keep secretions thin.  Tylenol and/or ibuprofen as needed for pain or fevers.   Rest.  If symptoms worsen or do not improve in the next week to return to be seen or to follow up with your PCP.     ED Prescriptions    Medication Sig Dispense Auth. Provider   oseltamivir (TAMIFLU) 75 MG capsule Take 1 capsule (75 mg total) by mouth every 12 (twelve) hours for 5 days. 10 capsule Augusto Gamble B, NP   benzonatate (TESSALON) 100 MG capsule Take 1 capsule (100 mg total) by mouth every 8 (eight) hours. 21 capsule Christoph Copelan B, NP   ondansetron (ZOFRAN) 4 MG tablet Take 1 tablet (4 mg total) by mouth every 8 (eight) hours as needed for nausea or vomiting. 10 tablet Zigmund Gottron, NP     Controlled Substance Prescriptions [place] Controlled Substance Registry consulted? Not Applicable   Zigmund Gottron, NP 03/04/18 1614

## 2018-03-04 NOTE — Discharge Instructions (Signed)
Chest xray is reassuring today.  May complete course of tamiflu. May cause stomach upset, may use zofran as needed for nausea.  Push fluids to ensure adequate hydration and keep secretions thin.  Tylenol and/or ibuprofen as needed for pain or fevers.   Rest.  If symptoms worsen or do not improve in the next week to return to be seen or to follow up with your PCP.

## 2018-03-04 NOTE — ED Triage Notes (Signed)
Pt presents with non productive cough for a few weeks along with shortness of breath, chills, nasal drainage, congestion, and generalized body aches.

## 2018-08-14 ENCOUNTER — Other Ambulatory Visit: Payer: Self-pay | Admitting: Otolaryngology

## 2018-08-15 DIAGNOSIS — Z86010 Personal history of colonic polyps: Secondary | ICD-10-CM | POA: Insufficient documentation

## 2018-09-05 ENCOUNTER — Other Ambulatory Visit: Payer: Self-pay

## 2018-09-05 ENCOUNTER — Encounter (HOSPITAL_BASED_OUTPATIENT_CLINIC_OR_DEPARTMENT_OTHER): Payer: Self-pay | Admitting: *Deleted

## 2018-09-07 ENCOUNTER — Other Ambulatory Visit (HOSPITAL_COMMUNITY)
Admission: RE | Admit: 2018-09-07 | Discharge: 2018-09-07 | Disposition: A | Discharge status: Home / Self Care | Payer: Medicare Other | Source: Ambulatory Visit | Attending: Otolaryngology | Admitting: Otolaryngology

## 2018-09-07 DIAGNOSIS — Z20828 Contact with and (suspected) exposure to other viral communicable diseases: Secondary | ICD-10-CM | POA: Insufficient documentation

## 2018-09-07 DIAGNOSIS — Z01812 Encounter for preprocedural laboratory examination: Secondary | ICD-10-CM | POA: Diagnosis present

## 2018-09-07 LAB — SARS CORONAVIRUS 2 (TAT 6-24 HRS): SARS Coronavirus 2: NEGATIVE

## 2018-09-11 ENCOUNTER — Encounter (HOSPITAL_BASED_OUTPATIENT_CLINIC_OR_DEPARTMENT_OTHER)
Admission: RE | Disposition: A | Discharge status: Home / Self Care | Payer: Self-pay | Source: Home / Self Care | Attending: Otolaryngology

## 2018-09-11 ENCOUNTER — Ambulatory Visit (HOSPITAL_BASED_OUTPATIENT_CLINIC_OR_DEPARTMENT_OTHER): Payer: Medicare Other | Admitting: Anesthesiology

## 2018-09-11 ENCOUNTER — Encounter (HOSPITAL_BASED_OUTPATIENT_CLINIC_OR_DEPARTMENT_OTHER): Payer: Self-pay | Admitting: Anesthesiology

## 2018-09-11 ENCOUNTER — Ambulatory Visit (HOSPITAL_BASED_OUTPATIENT_CLINIC_OR_DEPARTMENT_OTHER)
Admission: RE | Admit: 2018-09-11 | Discharge: 2018-09-11 | Disposition: A | Discharge status: Home / Self Care | Payer: Medicare Other | Attending: Otolaryngology | Admitting: Otolaryngology

## 2018-09-11 ENCOUNTER — Other Ambulatory Visit: Payer: Self-pay

## 2018-09-11 DIAGNOSIS — J343 Hypertrophy of nasal turbinates: Secondary | ICD-10-CM | POA: Diagnosis not present

## 2018-09-11 DIAGNOSIS — I10 Essential (primary) hypertension: Secondary | ICD-10-CM | POA: Diagnosis not present

## 2018-09-11 DIAGNOSIS — J45909 Unspecified asthma, uncomplicated: Secondary | ICD-10-CM | POA: Insufficient documentation

## 2018-09-11 DIAGNOSIS — M1990 Unspecified osteoarthritis, unspecified site: Secondary | ICD-10-CM | POA: Insufficient documentation

## 2018-09-11 DIAGNOSIS — J310 Chronic rhinitis: Secondary | ICD-10-CM | POA: Diagnosis not present

## 2018-09-11 DIAGNOSIS — J342 Deviated nasal septum: Secondary | ICD-10-CM | POA: Diagnosis not present

## 2018-09-11 DIAGNOSIS — J3489 Other specified disorders of nose and nasal sinuses: Secondary | ICD-10-CM | POA: Diagnosis not present

## 2018-09-11 HISTORY — DX: Unspecified asthma, uncomplicated: J45.909

## 2018-09-11 HISTORY — PX: NASAL SEPTOPLASTY W/ TURBINOPLASTY: SHX2070

## 2018-09-11 HISTORY — DX: Deviated nasal septum: J34.2

## 2018-09-11 SURGERY — SEPTOPLASTY, NOSE, WITH NASAL TURBINATE REDUCTION
Anesthesia: General | Site: Nose | Laterality: Bilateral

## 2018-09-11 MED ORDER — LIDOCAINE 2% (20 MG/ML) 5 ML SYRINGE
INTRAMUSCULAR | Status: DC | PRN
  Administered 2018-09-11: 100 mg via INTRAVENOUS

## 2018-09-11 MED ORDER — PROPOFOL 10 MG/ML IV BOLUS
INTRAVENOUS | Status: DC | PRN
  Administered 2018-09-11: 170 mg via INTRAVENOUS

## 2018-09-11 MED ORDER — FENTANYL CITRATE (PF) 100 MCG/2ML IJ SOLN
50.0000 ug | INTRAMUSCULAR | Status: DC | PRN
  Administered 2018-09-11: 100 ug via INTRAVENOUS

## 2018-09-11 MED ORDER — ACETAMINOPHEN 500 MG PO TABS
1000.0000 mg | ORAL_TABLET | Freq: Once | ORAL | Status: AC
  Administered 2018-09-11: 1000 mg via ORAL

## 2018-09-11 MED ORDER — AMOXICILLIN 875 MG PO TABS: 875.0000 mg | ORAL_TABLET | Freq: Two times a day (BID) | ORAL | 0 refills | Status: AC

## 2018-09-11 MED ORDER — EPHEDRINE SULFATE-NACL 50-0.9 MG/10ML-% IV SOSY
PREFILLED_SYRINGE | INTRAVENOUS | Status: DC | PRN
  Administered 2018-09-11: 10 mg via INTRAVENOUS

## 2018-09-11 MED ORDER — ACETAMINOPHEN 500 MG PO TABS
ORAL_TABLET | ORAL | Status: AC
  Filled 2018-09-11: qty 2

## 2018-09-11 MED ORDER — GLYCOPYRROLATE 0.2 MG/ML IJ SOLN
INTRAMUSCULAR | Status: DC | PRN
  Administered 2018-09-11: 0.2 mg via INTRAVENOUS

## 2018-09-11 MED ORDER — FENTANYL CITRATE (PF) 100 MCG/2ML IJ SOLN
INTRAMUSCULAR | Status: AC
  Filled 2018-09-11: qty 2

## 2018-09-11 MED ORDER — MUPIROCIN 2 % EX OINT
TOPICAL_OINTMENT | CUTANEOUS | Status: DC | PRN
  Administered 2018-09-11: 1 via TOPICAL

## 2018-09-11 MED ORDER — FENTANYL CITRATE (PF) 100 MCG/2ML IJ SOLN: 25.0000 ug | INTRAMUSCULAR | Status: DC | PRN

## 2018-09-11 MED ORDER — OXYMETAZOLINE HCL 0.05 % NA SOLN
NASAL | Status: DC | PRN
  Administered 2018-09-11: 1 via TOPICAL

## 2018-09-11 MED ORDER — ROCURONIUM BROMIDE 100 MG/10ML IV SOLN
INTRAVENOUS | Status: DC | PRN
  Administered 2018-09-11: 50 mg via INTRAVENOUS

## 2018-09-11 MED ORDER — PROPOFOL 10 MG/ML IV BOLUS
INTRAVENOUS | Status: AC
  Filled 2018-09-11: qty 20

## 2018-09-11 MED ORDER — DEXAMETHASONE SODIUM PHOSPHATE 4 MG/ML IJ SOLN
INTRAMUSCULAR | Status: DC | PRN
  Administered 2018-09-11: 10 mg via INTRAVENOUS

## 2018-09-11 MED ORDER — CEFAZOLIN SODIUM-DEXTROSE 2-3 GM-%(50ML) IV SOLR
INTRAVENOUS | Status: DC | PRN
  Administered 2018-09-11: 2 g via INTRAVENOUS

## 2018-09-11 MED ORDER — CEFAZOLIN SODIUM 1 G IJ SOLR
INTRAMUSCULAR | Status: AC
  Filled 2018-09-11: qty 20

## 2018-09-11 MED ORDER — MIDAZOLAM HCL 2 MG/2ML IJ SOLN
1.0000 mg | INTRAMUSCULAR | Status: DC | PRN
  Administered 2018-09-11: 11:00:00 2 mg via INTRAVENOUS

## 2018-09-11 MED ORDER — SUGAMMADEX SODIUM 200 MG/2ML IV SOLN
INTRAVENOUS | Status: DC | PRN
  Administered 2018-09-11: 200 mg via INTRAVENOUS

## 2018-09-11 MED ORDER — SUCCINYLCHOLINE CHLORIDE 20 MG/ML IJ SOLN
INTRAMUSCULAR | Status: DC | PRN
  Administered 2018-09-11: 100 mg via INTRAVENOUS

## 2018-09-11 MED ORDER — MIDAZOLAM HCL 2 MG/2ML IJ SOLN
INTRAMUSCULAR | Status: AC
  Filled 2018-09-11: qty 2

## 2018-09-11 MED ORDER — SCOPOLAMINE 1 MG/3DAYS TD PT72: 1.0000 | MEDICATED_PATCH | Freq: Once | TRANSDERMAL | Status: DC

## 2018-09-11 MED ORDER — LACTATED RINGERS IV SOLN
INTRAVENOUS | Status: DC
  Administered 2018-09-11: 11:00:00 via INTRAVENOUS

## 2018-09-11 MED ORDER — LIDOCAINE-EPINEPHRINE 1 %-1:100000 IJ SOLN
INTRAMUSCULAR | Status: DC | PRN
  Administered 2018-09-11: 2 mL

## 2018-09-11 MED ORDER — EPHEDRINE 5 MG/ML INJ
INTRAVENOUS | Status: AC
  Filled 2018-09-11: qty 10

## 2018-09-11 MED ORDER — PROMETHAZINE HCL 25 MG/ML IJ SOLN: 6.2500 mg | INTRAMUSCULAR | Status: DC | PRN

## 2018-09-11 MED ORDER — ONDANSETRON HCL 4 MG/2ML IJ SOLN
INTRAMUSCULAR | Status: DC | PRN
  Administered 2018-09-11: 4 mg via INTRAVENOUS

## 2018-09-11 MED ORDER — OXYCODONE-ACETAMINOPHEN 5-325 MG PO TABS: 1.0000 | ORAL_TABLET | ORAL | 0 refills | Status: AC | PRN

## 2018-09-11 SURGICAL SUPPLY — 39 items
ATTRACTOMAT 16X20 MAGNETIC DRP (DRAPES) IMPLANT
CANISTER SUCT 1200ML W/VALVE (MISCELLANEOUS) ×3 IMPLANT
COAGULATOR SUCT 8FR VV (MISCELLANEOUS) ×3 IMPLANT
COVER WAND RF STERILE (DRAPES) IMPLANT
DECANTER SPIKE VIAL GLASS SM (MISCELLANEOUS) IMPLANT
DRSG NASOPORE 8CM (GAUZE/BANDAGES/DRESSINGS) IMPLANT
DRSG TELFA 3X8 NADH (GAUZE/BANDAGES/DRESSINGS) IMPLANT
ELECT REM PT RETURN 9FT ADLT (ELECTROSURGICAL) ×3
ELECTRODE REM PT RTRN 9FT ADLT (ELECTROSURGICAL) ×1 IMPLANT
GLOVE BIO SURGEON STRL SZ 6.5 (GLOVE) ×1 IMPLANT
GLOVE BIO SURGEON STRL SZ7.5 (GLOVE) ×3 IMPLANT
GLOVE BIO SURGEONS STRL SZ 6.5 (GLOVE) ×1
GLOVE BIOGEL PI IND STRL 6.5 (GLOVE) IMPLANT
GLOVE BIOGEL PI INDICATOR 6.5 (GLOVE) ×2
GOWN STRL REUS W/ TWL LRG LVL3 (GOWN DISPOSABLE) ×2 IMPLANT
GOWN STRL REUS W/ TWL XL LVL3 (GOWN DISPOSABLE) IMPLANT
GOWN STRL REUS W/TWL LRG LVL3 (GOWN DISPOSABLE) ×3
GOWN STRL REUS W/TWL XL LVL3 (GOWN DISPOSABLE) ×3
NDL HYPO 25X1 1.5 SAFETY (NEEDLE) ×1 IMPLANT
NEEDLE HYPO 25X1 1.5 SAFETY (NEEDLE) ×3 IMPLANT
NS IRRIG 1000ML POUR BTL (IV SOLUTION) ×3 IMPLANT
PACK BASIN DAY SURGERY FS (CUSTOM PROCEDURE TRAY) ×3 IMPLANT
PACK ENT DAY SURGERY (CUSTOM PROCEDURE TRAY) ×3 IMPLANT
PAD DRESSING TELFA 3X8 NADH (GAUZE/BANDAGES/DRESSINGS) IMPLANT
SLEEVE SCD COMPRESS KNEE MED (MISCELLANEOUS) ×2 IMPLANT
SOLUTION BUTLER CLEAR DIP (MISCELLANEOUS) ×3 IMPLANT
SPLINT NASAL AIRWAY SILICONE (MISCELLANEOUS) ×2 IMPLANT
SPONGE GAUZE 2X2 8PLY STER LF (GAUZE/BANDAGES/DRESSINGS) ×1
SPONGE GAUZE 2X2 8PLY STRL LF (GAUZE/BANDAGES/DRESSINGS) ×2 IMPLANT
SPONGE NEURO XRAY DETECT 1X3 (DISPOSABLE) ×3 IMPLANT
SUT CHROMIC 4 0 P 3 18 (SUTURE) ×3 IMPLANT
SUT PLAIN 4 0 ~~LOC~~ 1 (SUTURE) ×3 IMPLANT
SUT PROLENE 3 0 PS 2 (SUTURE) ×2 IMPLANT
SUT VIC AB 4-0 P-3 18XBRD (SUTURE) IMPLANT
SUT VIC AB 4-0 P3 18 (SUTURE)
TOWEL GREEN STERILE FF (TOWEL DISPOSABLE) ×3 IMPLANT
TUBE SALEM SUMP 12R W/ARV (TUBING) IMPLANT
TUBE SALEM SUMP 16 FR W/ARV (TUBING) ×3 IMPLANT
YANKAUER SUCT BULB TIP NO VENT (SUCTIONS) ×3 IMPLANT

## 2018-09-11 NOTE — Discharge Instructions (Addendum)

## 2018-09-11 NOTE — Transfer of Care (Signed)
Immediate Anesthesia Transfer of Care Note  Patient: Eric Cordova  Procedure(s) Performed: NASAL SEPTOPLASTY WITH BILATERAL TURBINATE REDUCTION (Bilateral Nose)  Patient Location: PACU  Anesthesia Type:General  Level of Consciousness: awake  Airway & Oxygen Therapy: Patient Spontanous Breathing and Patient connected to face mask oxygen  Post-op Assessment: Report given to RN and Post -op Vital signs reviewed and stable  Post vital signs: Reviewed and stable  Last Vitals:  Vitals Value Taken Time  BP    Temp    Pulse 74 09/11/18 1143  Resp 14 09/11/18 1143  SpO2 99 % 09/11/18 1143  Vitals shown include unvalidated device data.  Last Pain:  Vitals:   09/11/18 0916  TempSrc: Oral  PainSc: 1          Complications: No apparent anesthesia complications

## 2018-09-11 NOTE — Anesthesia Preprocedure Evaluation (Signed)
Anesthesia Evaluation  Patient identified by MRN, date of birth, ID band Patient awake  General Assessment Comment:DEVIATED [place]  Reviewed: Allergy & Precautions, NPO status , Patient's Chart, lab work & pertinent test results  Airway Mallampati: II  TM Distance: >3 FB Neck ROM: Full    Dental  (+) Teeth Intact, Dental Advisory Given   Pulmonary asthma ,    Pulmonary exam normal breath sounds clear to auscultation       Cardiovascular hypertension, Normal cardiovascular exam Rhythm:Regular Rate:Normal     Neuro/Psych negative neurological ROS     GI/Hepatic negative GI ROS, Neg liver ROS,   Endo/Other  Hypothyroidism   Renal/GU negative Renal ROS     Musculoskeletal  (+) Arthritis ,   Abdominal   Peds  Hematology negative hematology ROS (+)   Anesthesia Other Findings Day of surgery medications reviewed with the patient.  Reproductive/Obstetrics                             Anesthesia Physical Anesthesia Plan  ASA: II  Anesthesia Plan: General   Post-op Pain Management:    Induction: Intravenous  PONV Risk Score and Plan: 3 and Midazolam, Dexamethasone and Ondansetron  Airway Management Planned: Oral ETT  Additional Equipment:   Intra-op Plan:   Post-operative Plan: Extubation in OR  Informed Consent: I have reviewed the patients History and Physical, chart, labs and discussed the procedure including the risks, benefits and alternatives for the proposed anesthesia with the patient or authorized representative who has indicated his/her understanding and acceptance.     Dental advisory given  Plan Discussed with: CRNA  Anesthesia Plan Comments:         Anesthesia Quick Evaluation

## 2018-09-11 NOTE — Anesthesia Procedure Notes (Signed)
Procedure Name: Intubation Date/Time: 09/11/2018 10:48 AM Performed by: Lieutenant Diego, CRNA Pre-anesthesia Checklist: Patient identified, Emergency Drugs available, Suction available and Patient being monitored Patient Re-evaluated:Patient Re-evaluated prior to induction Oxygen Delivery Method: Circle system utilized Preoxygenation: Pre-oxygenation with 100% oxygen Induction Type: IV induction Ventilation: Two handed mask ventilation required Laryngoscope Size: Miller and 2 Grade View: Grade II Tube type: Oral Tube size: 8.0 mm Number of attempts: 2 Airway Equipment and Method: Stylet,  Oral airway and Bougie stylet Placement Confirmation: ETT inserted through vocal cords under direct vision,  positive ETCO2 and breath sounds checked- equal and bilateral Secured at: 23 cm Tube secured with: Tape Dental Injury: Teeth and Oropharynx as per pre-operative assessment

## 2018-09-11 NOTE — Op Note (Signed)
DATE OF PROCEDURE: 09/11/2018  OPERATIVE REPORT   SURGEON: Leta Baptist, MD   PREOPERATIVE DIAGNOSES:  1. Severe nasal septal deviation.  2. Bilateral inferior turbinate hypertrophy.  3. Chronic nasal obstruction.  POSTOPERATIVE DIAGNOSES:  1. Severe nasal septal deviation.  2. Bilateral inferior turbinate hypertrophy.  3. Chronic nasal obstruction.  PROCEDURE PERFORMED:  1. Septoplasty.  2. Bilateral partial inferior turbinate resection.   ANESTHESIA: General endotracheal tube anesthesia.   COMPLICATIONS: None.   ESTIMATED BLOOD LOSS: 50 mL.   INDICATION FOR PROCEDURE: Eric Cordova is a 68 y.o. male with a history of chronic nasal obstruction. The patient was treated with antihistamine, decongestant, steroid nasal spray, and systemic steroids. However, the patient continued to be symptomatic. On examination, the patient was noted to have bilateral inferior turbinate hypertrophy and significant nasal septal deviation, causing significant nasal obstruction. Based on the above findings, the decision was made for the patient to undergo the above-stated procedures. The risks, benefits, alternatives, and details of the procedures were discussed with the patient. Questions were invited and answered. Informed consent was obtained.   DESCRIPTION OF PROCEDURE: The patient was taken to the operating room and placed supine on the operating table. General endotracheal tube anesthesia was administered by the anesthesiologist. The patient was positioned, and prepped and draped in the standard fashion for nasal surgery. Pledgets soaked with Afrin were placed in both nasal cavities for decongestion. The pledgets were subsequently removed.  Examination of the nasal cavity revealed a severe nasal septal deviationd. 1% lidocaine with 1:100,000 epinephrine was injected onto the nasal septum bilaterally. A hemitransfixion incision was made on the left side. The mucosal flap was carefully elevated on the left  side. A cartilaginous incision was made 1 cm superior to the caudal margin of the nasal septum. Mucosal flap was also elevated on the right side in the similar fashion. It should be noted that due to the septal deviation, the deviated portion of the cartilaginous and bony septum had to be removed in piecemeal fashion. Once the deviated portions were removed, a straight midline septum was achieved. The hemitransfixion incision was closed with interrupted 4-0 chromic sutures.   The inferior one half of both hypertrophied inferior turbinate was crossclamped with a Kelly clamp. The inferior one half of each inferior turbinate was then resected with a pair of cross cutting scissors. Hemostasis was achieved with a suction cautery device.  Doyle splints were applied to the nasal septum.  The care of the patient was turned over to the anesthesiologist. The patient was awakened from anesthesia without difficulty. The patient was extubated and transferred to the recovery room in good condition.   OPERATIVE FINDINGS: Nasal septal deviation and bilateral inferior turbinate hypertrophy.   SPECIMEN: None.   FOLLOWUP CARE: The patient be discharged home once he is awake and alert. The patient will be placed on Percocet 1 tablets p.o. q.4 hours p.r.n. pain, and amoxicillin 875 mg p.o. b.i.d. for 3 days. The patient will follow up in my office in 3 days for splint removal.   Juliano Mceachin Raynelle Bring, MD

## 2018-09-11 NOTE — H&P (Signed)
Cc: Chronic nasal obstruction  HPI:  The patient is a 68 year old male who returns today for his follow-up evaluation. The patient was last seen 1 month ago.  At that time, he was noted to have bilateral nasal obstruction, worse on the left side.  He was noted to have nasal septal deviation and bilateral inferior turbinate hypertrophy.  The patient was treated with Flonase nasal spray and Prednisone.  According to the patient, he continues to have bilateral nasal obstruction, worse on the left side.  He denies any facial pain, pressure, headache or purulent drainage.  He has not responded to the medical treatment.   Exam: The nasal cavities were decongested and anesthetised with a combination of oxymetazoline and 4% lidocaine solution.  The flexible scope was inserted into the right nasal cavity. Septal spur noted.  Endoscopy of the inferior and middle meatus was performed.  The edematous mucosa was as described above.  No polyp, mass, or lesion was appreciated.  Olfactory cleft was clear.  Nasopharynx was clear.  Turbinates were hypertrophied but without mass.  Incomplete response to decongestion.  The procedure was repeated on the contralateral side with NSD.  The patient tolerated the procedure well.  Instructions were given to avoid eating or drinking for 2 hours.   Assessment:  Persistent chronic rhinitis with nasal mucosal congestion, nasal septal deviation, and bilateral inferior turbinate hypertrophy.  His nasal septal deviation is to the left, with a septal spur on the right.   Plan: 1.  The nasal endoscopy findings are reviewed with the patient.   2.  Continue Flonase nasal spray daily.   3.  In light of his persistent symptoms, he may benefit from undergoing surgical intervention with septoplasty and turbinate reduction.  The risks, benefits, alternatives and details of the procedure are reviewed with the patient.  4.  The patient would like to proceed with the procedures.

## 2018-09-12 ENCOUNTER — Encounter (HOSPITAL_BASED_OUTPATIENT_CLINIC_OR_DEPARTMENT_OTHER): Payer: Self-pay | Admitting: Otolaryngology

## 2018-09-13 NOTE — Anesthesia Postprocedure Evaluation (Signed)
Anesthesia Post Note  Patient: Eric Cordova  Procedure(s) Performed: NASAL SEPTOPLASTY WITH BILATERAL TURBINATE REDUCTION (Bilateral Nose)     Patient location during evaluation: PACU Anesthesia Type: General Level of consciousness: awake and alert Pain management: pain level controlled Vital Signs Assessment: post-procedure vital signs reviewed and stable Respiratory status: spontaneous breathing, nonlabored ventilation, respiratory function stable and patient connected to nasal cannula oxygen Cardiovascular status: blood pressure returned to baseline and stable Postop Assessment: no apparent nausea or vomiting Anesthetic complications: no    Last Vitals:  Vitals:   09/11/18 1230 09/11/18 1300  BP: (!) 156/80 (!) 158/89  Pulse: (!) 57 (!) 44  Resp: 15 16  Temp:  36.6 C  SpO2: 98% 99%    Last Pain:  Vitals:   09/11/18 1300  TempSrc:   PainSc: 2                  Catalina Gravel

## 2019-05-26 ENCOUNTER — Encounter (HOSPITAL_COMMUNITY): Payer: Self-pay

## 2019-05-26 ENCOUNTER — Other Ambulatory Visit: Payer: Self-pay

## 2019-05-26 ENCOUNTER — Emergency Department (HOSPITAL_COMMUNITY): Payer: Medicare Other

## 2019-05-26 ENCOUNTER — Emergency Department (HOSPITAL_COMMUNITY)
Admission: EM | Admit: 2019-05-26 | Discharge: 2019-05-26 | Disposition: A | Discharge status: Home / Self Care | Payer: Medicare Other | Attending: Emergency Medicine | Admitting: Emergency Medicine

## 2019-05-26 DIAGNOSIS — R0789 Other chest pain: Secondary | ICD-10-CM | POA: Diagnosis present

## 2019-05-26 DIAGNOSIS — Z79899 Other long term (current) drug therapy: Secondary | ICD-10-CM | POA: Insufficient documentation

## 2019-05-26 DIAGNOSIS — E039 Hypothyroidism, unspecified: Secondary | ICD-10-CM | POA: Insufficient documentation

## 2019-05-26 DIAGNOSIS — I10 Essential (primary) hypertension: Secondary | ICD-10-CM | POA: Insufficient documentation

## 2019-05-26 DIAGNOSIS — I4891 Unspecified atrial fibrillation: Secondary | ICD-10-CM | POA: Insufficient documentation

## 2019-05-26 DIAGNOSIS — J45909 Unspecified asthma, uncomplicated: Secondary | ICD-10-CM | POA: Diagnosis not present

## 2019-05-26 DIAGNOSIS — R002 Palpitations: Secondary | ICD-10-CM

## 2019-05-26 LAB — CBC
HCT: 46.1 % (ref 39.0–52.0)
Hemoglobin: 14.9 g/dL (ref 13.0–17.0)
MCH: 28.1 pg (ref 26.0–34.0)
MCHC: 32.3 g/dL (ref 30.0–36.0)
MCV: 86.8 fL (ref 80.0–100.0)
Platelets: 192 10*3/uL (ref 150–400)
RBC: 5.31 MIL/uL (ref 4.22–5.81)
RDW: 13.8 % (ref 11.5–15.5)
WBC: 7.8 10*3/uL (ref 4.0–10.5)
nRBC: 0 % (ref 0.0–0.2)

## 2019-05-26 LAB — BASIC METABOLIC PANEL
Anion gap: 12 (ref 5–15)
BUN: 27 mg/dL — ABNORMAL HIGH (ref 8–23)
CO2: 23 mmol/L (ref 22–32)
Calcium: 10 mg/dL (ref 8.9–10.3)
Chloride: 105 mmol/L (ref 98–111)
Creatinine, Ser: 0.98 mg/dL (ref 0.61–1.24)
GFR calc Af Amer: >60 mL/min (ref >60)
GFR calc non Af Amer: >60 mL/min (ref >60)
Glucose, Bld: 117 mg/dL — ABNORMAL HIGH (ref 70–99)
Potassium: 3.9 mmol/L (ref 3.5–5.1)
Sodium: 140 mmol/L (ref 135–145)

## 2019-05-26 LAB — TROPONIN I (HIGH SENSITIVITY)
Troponin I (High Sensitivity): 11 ng/L (ref <18)
Troponin I (High Sensitivity): 17 ng/L (ref <18)

## 2019-05-26 MED ORDER — APIXABAN 5 MG PO TABS
5.0000 mg | ORAL_TABLET | ORAL | Status: DC
  Filled 2019-05-26: qty 1

## 2019-05-26 MED ORDER — ETOMIDATE 2 MG/ML IV SOLN
INTRAVENOUS | Status: AC | PRN
  Administered 2019-05-26: 10 mg via INTRAVENOUS

## 2019-05-26 MED ORDER — APIXABAN 5 MG PO TABS
5.0000 mg | ORAL_TABLET | Freq: Two times a day (BID) | ORAL | 1 refills | Status: DC
Start: 2019-05-26 — End: 2019-08-06

## 2019-05-26 MED ORDER — SODIUM CHLORIDE 0.9% FLUSH
3.0000 mL | Freq: Once | INTRAVENOUS | Status: AC
  Administered 2019-05-26: 3 mL via INTRAVENOUS

## 2019-05-26 MED ORDER — APIXABAN 5 MG PO TABS: 5.0000 mg | ORAL_TABLET | Freq: Two times a day (BID) | ORAL | Status: DC

## 2019-05-26 MED ORDER — SODIUM CHLORIDE (PF) 0.9 % IJ SOLN
INTRAMUSCULAR | Status: AC
  Filled 2019-05-26: qty 50

## 2019-05-26 MED ORDER — IOHEXOL 350 MG/ML SOLN
100.0000 mL | Freq: Once | INTRAVENOUS | Status: AC | PRN
  Administered 2019-05-26: 100 mL via INTRAVENOUS

## 2019-05-26 MED ORDER — ETOMIDATE 2 MG/ML IV SOLN
10.0000 mg | Freq: Once | INTRAVENOUS | Status: DC
  Filled 2019-05-26: qty 10

## 2019-05-26 NOTE — ED Triage Notes (Signed)
Pt reports chest palpitations. Reports no cardiac hx. Afib noted on monitor. MD made aware.

## 2019-05-26 NOTE — ED Notes (Signed)
Pt lying in bed, eye's closed, chest rising and falling. Pt awakens easily. Updated on plan of care. Pt denies any needs. Will continue to monitor.

## 2019-05-26 NOTE — ED Notes (Signed)
Pt up walking around the room. Family at bedside. NAD noted. Pt ready for d/c

## 2019-05-26 NOTE — ED Provider Notes (Signed)
Emergency Department Provider Note  I have reviewed the triage vital signs and the nursing notes.  HISTORY  Chief Complaint Chest Pain   HPI Eric Cordova is a 69 y.o. male without significant past medical history who presents the emergency department today for palpitations.  Patient states that everything was normal has not been sick recently and has been eating and drinking normally.  He states he was walking up the steps to go to bed and he felt an acute acute onset of palpitations.  His watch can do some type of EKG and and told he was in atrial fibrillation so he presented to the emergency room for further evaluation.  He states somewhere along the line he has some left lateral chest pain but that has resolved at this point does not feel short of breath.  No fevers.  No lightheadedness.  No other associated symptoms at this time.  He has no history of atrial fibrillation.   No other associated or modifying symptoms.    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    w/ irritation   Pneumonia     Patient Active Problem List   Diagnosis Date Noted   Lichen sclerosus-associated penile intraepithelial neoplasia 02/16/2015   Unspecified vitamin D deficiency 12/03/2012   Hyperlipidemia    Hypothyroidism    Allergy    Degenerative joint disease    Asthma    Hypertension    Prediabetes    Incisional hernia 08/16/2011   Postop check 11/30/2010   Diverticulosis of colon 10/19/2010   ABDOMINAL PAIN, GENERALIZED 03/04/2009   DIARRHEA-PRESUMED INFECTIOUS 02/19/2009   DIVERTICULITIS-COLON 02/19/2009   ABNORMAL FINDINGS GI TRACT 02/19/2009    Past Surgical History:  Procedure Laterality Date   COLON SURGERY     CYSTOSCOPY N/A 12/26/2014   Procedure:  CYSTOSCOPY;  Surgeon: Alexis Frock, MD;  Location: [facility];  Service: Urology;  Laterality: N/A;   HERNIA REPAIR  1960, 1974   inguinal bilateral   HYDROCELE EXCISION Right 1984   INGUINAL HERNIA REPAIR Bilateral 1960 & 1974   INSERTION OF MESH  11/07/2011   Procedure: INSERTION OF MESH;  Surgeon: Gwenyth Ober, MD;  Location: [place];  Service: General;  Laterality: N/A;   LAPAROSCOPIC SIGMOID COLECTOMY  11-12-2010   diverticulitis   NASAL SEPTOPLASTY W/ TURBINOPLASTY Bilateral 09/11/2018   Procedure: NASAL SEPTOPLASTY WITH BILATERAL TURBINATE REDUCTION;  Surgeon: Leta Baptist, MD;  Location: [place];  Service: ENT;  Laterality: Bilateral;   [place] N/A 12/26/2014   Procedure: PENILE  SKIN BIOPSY WITH PENILE BLOCK;  Surgeon: Alexis Frock, MD;  Location: [facility];  Service: Urology;  Laterality: N/A;   PILONIDAL CYST EXCISION  1973   TONSILLECTOMY  1958   VENTRAL HERNIA REPAIR  11/07/2011   Procedure: LAPAROSCOPIC VENTRAL HERNIA;  Surgeon: Gwenyth Ober, MD;  Location: [place];  Service: General;  Laterality: N/A;    Current Outpatient Rx   Order #: [id number] Class: Historical Med   Order #: [id number] Class: Historical Med   Order #: [id number] Class: Historical Med   Order #: [id number] Class: Historical Med   Order #: [id number] Class: Print    Allergies Statins  Family History  Problem Relation Age of Onset   Cancer Mother        breast   Thyroid disease Mother    Asthma Mother    Cancer Father        prostate   Dementia Father    Heart disease Father    Cancer Maternal Uncle        prostate   Hypertension Brother    Arthritis Maternal Grandmother     Social History Social History   Tobacco Use   Smoking status: Never Smoker   Smokeless tobacco: Never Used  Substance Use Topics   Alcohol use: Yes    Alcohol/week: 7.0 - 14.0 standard drinks    Types: 7 - 14  Shots of liquor per week    Comment: 1 to 2  shots daily   Drug use: No    Review of Systems  All other systems negative except as documented in the HPI. All pertinent positives and negatives as reviewed in the HPI. ____________________________________________  PHYSICAL EXAM:  VITAL SIGNS: Vitals:   05/26/19 0415 05/26/19 0456  BP: (!) 158/99 136/79  Pulse: (!) 59 (!) 59  Resp: 16 11  Temp:  97.8 F (36.6 C)  SpO2: 100% 96%   Constitutional: Alert and oriented. Well appearing and in no acute distress. Eyes: Conjunctivae are normal. PERRL. EOMI. Head: Atraumatic. Nose: No congestion/rhinnorhea. Mouth/Throat: Mucous membranes are moist.  Oropharynx non-erythematous. Neck: No stridor.  No meningeal signs.   Cardiovascular: tachycardic rate, regular rhythm. Good peripheral circulation. Grossly normal heart sounds.   Respiratory: tachypenic respiratory effort.  No retractions. Lungs CTAB. Gastrointestinal: Soft and nontender. No distention.  Musculoskeletal: No lower extremity tenderness nor edema. No gross deformities of extremities. Neurologic:  Normal speech and language. No gross focal neurologic deficits are appreciated.  Skin:  Skin is warm, dry and intact. No rash noted.  ____________________________________________   LABS (all labs ordered are listed, but only abnormal results are displayed)  Labs Reviewed  BASIC METABOLIC PANEL - Abnormal; Notable for the following components:      Result Value   Glucose, Bld 117 (*)    BUN 27 (*)    All other components within normal limits  CBC  TROPONIN I (HIGH SENSITIVITY)  TROPONIN I (HIGH SENSITIVITY)   ____________________________________________  EKG   EKG Interpretation  Date/Time:  Sunday May 26 2019 00:50:09 EDT Ventricular Rate:  148 PR Interval:    QRS Duration: 96 QT Interval:  248 QTC Calculation: 414 R Axis:   68 Text Interpretation: Atrial fibrillation with rapid V-rate Repolarization abnormality,  prob rate related Baseline wander in lead(s) V2 Confirmed by Merrily Pew [phone]) on 05/26/2019 7:55:13 AM       EKG Interpretation  Date/Time:  Sunday May 26 2019 02:54:46 EDT Ventricular Rate:  64 PR Interval:    QRS Duration: 89 QT Interval:  384 QTC Calculation: 397 R Axis:   51 Text Interpretation: Sinus rhythm Probable left atrial enlargement Borderline repolarization abnormality Baseline wander in lead(s) V4 V5 V6 similar to baseline rhythm, no longer in Afib Confirmed by Merrily Pew [phone]) on 05/26/2019 7:55:50 AM       ____________________________________________  RADIOLOGY  CT Angio Chest PE W and/or Wo Contrast  Result Date: 05/26/2019 CLINICAL DATA:  Atrial fibrillation EXAM: CT ANGIOGRAPHY CHEST WITH CONTRAST TECHNIQUE: Multidetector CT imaging of the chest was performed using the standard protocol during bolus administration of intravenous contrast. Multiplanar CT image reconstructions and MIPs were obtained to evaluate the vascular anatomy. CONTRAST:  168mL OMNIPAQUE IOHEXOL 350 MG/ML SOLN COMPARISON:  None. FINDINGS: Cardiovascular: Contrast injection is sufficient to demonstrate satisfactory opacification of the pulmonary arteries to the segmental level. There is no pulmonary embolus or evidence of right heart strain. The size of the main pulmonary artery is normal. Heart size is normal, with no pericardial effusion. The course and caliber of the aorta are normal. There is mild atherosclerotic calcification. Opacification decreased due to pulmonary arterial phase contrast bolus timing. Mediastinum/Nodes: No mediastinal, hilar or axillary lymphadenopathy. Normal visualized thyroid. Thoracic esophageal course is normal. Lungs/Pleura: Airways are patent. No pleural effusion, lobar consolidation, pneumothorax or pulmonary infarction. Upper Abdomen: Contrast bolus timing is not optimized for evaluation of the abdominal organs. The visualized portions of the organs of the upper  abdomen are normal. Musculoskeletal: No chest wall abnormality. No bony spinal canal stenosis. Review of the MIP images confirms the above findings. IMPRESSION: No pulmonary embolus or other acute thoracic abnormality. Electronically Signed   By: Ulyses Jarred M.D.   On: 05/26/2019 04:51   DG Chest Port 1 View  Result Date: 05/26/2019 CLINICAL DATA:  Chest palpitations EXAM: PORTABLE CHEST 1 VIEW COMPARISON:  None. FINDINGS: The heart size and mediastinal contours are within normal limits. Both lungs are clear. The visualized skeletal structures are unremarkable. IMPRESSION: No active disease. Electronically Signed   By: Ulyses Jarred M.D.   On: 05/26/2019 02:01   ____________________________________________  PROCEDURES  Procedure(s) performed:   .Critical Care Performed by: Merrily Pew, MD Authorized by: Merrily Pew, MD   Critical care provider statement:    Critical care time (minutes):  45   Critical care was necessary to treat or prevent imminent or life-threatening deterioration of the following conditions:  Circulatory failure and cardiac failure   Critical care was time spent personally by me on the following activities:  Discussions with consultants, evaluation of patient's response to treatment, examination of patient, ordering and performing treatments and interventions, ordering and review of laboratory studies, ordering and review of radiographic studies, pulse oximetry, re-evaluation of patient's condition, obtaining history from patient or surrogate and review of old charts .Sedation  Date/Time: 05/26/2019 4:50 AM Performed by: Merrily Pew, MD Authorized by: Merrily Pew, MD   Consent:    Consent obtained:  Verbal   Consent given by:  Patient   Risks discussed:  Allergic reaction, dysrhythmia, inadequate sedation, nausea, prolonged hypoxia resulting in organ damage, prolonged sedation necessitating reversal, respiratory compromise necessitating ventilatory assistance  and intubation and vomiting   Alternatives discussed:  Analgesia without sedation, anxiolysis and regional anesthesia Universal protocol:    Procedure explained and questions answered to patient or proxy's satisfaction: yes     Relevant documents present and verified: yes     Test results available and properly labeled: yes     Imaging studies available: yes     Required blood products, implants, devices, and special equipment available: yes     Site/side marked: yes     Immediately prior to procedure a time out was called: yes     Patient identity confirmation method:  Verbally with patient Indications:    Procedure necessitating sedation performed by:  Physician performing sedation Pre-sedation assessment:    Time since last food or drink:  3   ASA classification: class 1 - normal, healthy patient     Neck mobility: normal     Mouth opening:  3 or more finger widths   Thyromental distance:  4 finger widths   Mallampati score:  I -  soft palate, uvula, fauces, pillars visible   Pre-sedation assessments completed and reviewed: airway patency, cardiovascular function, hydration status, mental status, nausea/vomiting, pain level, respiratory function and temperature   Immediate pre-procedure details:    Reassessment: Patient reassessed immediately prior to procedure     Reviewed: vital signs, relevant labs/tests and NPO status     Verified: bag valve mask available, emergency equipment available, intubation equipment available, IV patency confirmed, oxygen available and suction available   Procedure details (see MAR for exact dosages):    Preoxygenation:  Nasal cannula   Sedation:  Etomidate   Intended level of sedation: moderate (conscious sedation)   Analgesia:  None   Intra-procedure monitoring:  Blood pressure monitoring, cardiac monitor, continuous pulse oximetry, frequent LOC assessments, frequent vital sign checks and continuous capnometry   Intra-procedure events: none     Total  Provider sedation time (minutes):  22 Post-procedure details:    Post-sedation assessment completed:  05/26/2019 4:50 AM   Attendance: Constant attendance by certified staff until patient recovered     Recovery: Patient returned to pre-procedure baseline     Post-sedation assessments completed and reviewed: airway patency, cardiovascular function, hydration status, mental status, nausea/vomiting, pain level, respiratory function and temperature     Patient is stable for discharge or admission: yes     Patient tolerance:  Tolerated well, no immediate complications .Cardioversion  Date/Time: 05/26/2019 4:51 AM Performed by: Merrily Pew, MD Authorized by: Merrily Pew, MD   Consent:    Consent obtained:  Verbal and written   Consent given by:  Patient and spouse   Risks discussed:  Cutaneous burn, death, induced arrhythmia and pain   Alternatives discussed:  No treatment, rate-control medication, referral, anti-coagulation medication, observation and delayed treatment Pre-procedure details:    Cardioversion basis:  Emergent   Rhythm:  Atrial fibrillation   Electrode placement:  Anterior-posterior Patient sedated: Yes. Refer to sedation procedure documentation for details of sedation.  Attempt one:    Cardioversion mode:  Synchronous   Shock (Joules):  120   Shock outcome:  Conversion to normal sinus rhythm Post-procedure details:    Patient status:  Awake   Patient tolerance of procedure:  Tolerated well, no immediate complications   ____________________________________________  INITIAL IMPRESSION / ASSESSMENT AND PLAN / ED COURSE   This patient presents to the ED for concern of palpitations, this involves an extensive number of treatment options, and is a complaint that carries with it a high risk of complications and morbidity.  The differential diagnosis includes ventricular tachycardia, ventricular fibrillation, SVT, sinus tachycardia or atrial fibrillation with rapid  ventricular response.  Lab Tests:   I Ordered, reviewed, and interpreted labs, which included a CBC which did not show any anemia or leukocytosis, BMP which showed mildly elevated glucose but not consistent with diabetes and elevated BUN.  Also had 2 troponins done secondary to some mild chest discomfort and those were both negative.  Medicines ordered:   I ordered medication etomidate for sedation and Eliquis for atrial fibrillation which he refused.  Imaging Studies ordered:   I independently visualized and interpreted imaging chest x-ray and CT scan which showed no obvious abnormalities.  Additional history obtained:   Additional history obtained from wife at bedside.  Previous records obtained and reviewed in epic  Consultations Obtained:   I consulted no one and discussed lab and imaging findings  Reevaluation:  After the interventions stated above, I reevaluated the patient and found he was in a sinus rhythm without palpitations  or chest pain after the cardioversion.  Patient feels at baseline.  Patient states that he does not want take blood thinners but does not really have a good reason why.  He says he will take aspirin if needed I encouraged him to take blood thinners at least until he followed up with cardiology he said he would consider it but it was unlikely.  I explained to them the risk of stroke and other disabling conditions if he develops a clot that embolize is however ultimately his decision.  He may want a follow-up with Dr. Einar Gip however referral to A. fib clinic has been placed.  CTA done to evaluate for PE as the initiation of his A. fib as he did have a little bit of chest pain initially and some intermittent hypoxia however I think the hypoxia was likely related to obstruction.  Critical Interventions: Prompt electrocardioversion Attempted to give anticoagulants  CHA2DS2/VAS Stroke Risk Points  Current as of about an hour ago     2 >= 2 Points: High  Risk  1 - 1.99 Points: Medium Risk  0 Points: Low Risk    The patient's score has not changed in the past year.: No Change     Details    This score determines the patient's risk of having a stroke if the  patient has atrial fibrillation.       Points Metrics  0 Has Congestive Heart Failure:  No    Current as of about an hour ago  0 Has Vascular Disease:  No    Current as of about an hour ago  1 Has Hypertension:  Yes    Current as of about an hour ago  1 Age:  26    Current as of about an hour ago  0 Has Diabetes:  No    Current as of about an hour ago  0 Had Stroke:  No  Had TIA:  No  Had thromboembolism:  No    Current as of about an hour ago  0 Male:  No    Current as of about an hour ago     A medical screening exam was performed and I feel the patient has had an appropriate workup for their chief complaint at this time and likelihood of emergent condition existing is low. They have been counseled on decision, discharge, follow up and which symptoms necessitate immediate return to the emergency department. They or their family verbally stated understanding and agreement with plan and discharged in stable condition.   ____________________________________________  FINAL CLINICAL IMPRESSION(S) / ED DIAGNOSES  Final diagnoses:  Atrial fibrillation with rapid ventricular response (HCC)    MEDICATIONS GIVEN DURING THIS VISIT:  Medications  etomidate (AMIDATE) injection 10 mg (10 mg Intravenous See Procedure Record 05/26/19 0316)  apixaban (ELIQUIS) tablet 5 mg (has no administration in time range)  apixaban (ELIQUIS) tablet 5 mg (5 mg Oral Not Given 05/26/19 0500)  sodium chloride (PF) 0.9 % injection (has no administration in time range)  sodium chloride flush (NS) 0.9 % injection 3 mL (3 mLs Intravenous Given 05/26/19 0114)  etomidate (AMIDATE) injection (10 mg Intravenous Given 05/26/19 0247)  iohexol (OMNIPAQUE) 350 MG/ML injection 100 mL (100 mLs Intravenous Contrast  Given 05/26/19 0431)    NEW OUTPATIENT MEDICATIONS STARTED DURING THIS VISIT:  Discharge Medication List as of 05/26/2019  5:07 AM    START taking these medications   Details  apixaban (ELIQUIS) 5 MG TABS tablet Take 1 tablet (5 mg  total) by mouth 2 (two) times daily., Starting Sun 05/26/2019, Until Thu 07/25/2019, Print        Note:  This note was prepared with assistance of Dragon voice recognition software. Occasional wrong-word or sound-a-like substitutions may have occurred due to the inherent limitations of voice recognition software.   Meron Bocchino, Corene Cornea, MD 05/26/19 [phone]

## 2019-05-26 NOTE — ED Notes (Signed)
Pt sitting up in bed awake. NAD noted. Family at bedside.

## 2019-05-26 NOTE — Discharge Instructions (Addendum)

## 2019-05-26 NOTE — ED Notes (Signed)
Pt lying in bed. Full cardiac monitor placed. MD at bedside. Defibrillator pads placed on pt.

## 2019-06-18 ENCOUNTER — Ambulatory Visit: Payer: Self-pay | Admitting: Cardiology

## 2019-06-19 ENCOUNTER — Telehealth: Payer: Self-pay | Admitting: Interventional Cardiology

## 2019-06-19 NOTE — Telephone Encounter (Signed)
    I went in pt's chart to see what his ER notes daid from 05-26-19. He wanted to make an appt. Appt was made for 08-06-19 with Dr Irish Lack

## 2019-08-06 ENCOUNTER — Ambulatory Visit: Payer: Medicare Other | Admitting: Interventional Cardiology

## 2019-08-06 ENCOUNTER — Other Ambulatory Visit: Payer: Self-pay

## 2019-08-06 ENCOUNTER — Telehealth: Payer: Self-pay

## 2019-08-06 ENCOUNTER — Encounter: Payer: Self-pay | Admitting: Interventional Cardiology

## 2019-08-06 VITALS — BP 144/72 | HR 64 | Ht 67.0 in | Wt 220.0 lb

## 2019-08-06 DIAGNOSIS — E785 Hyperlipidemia, unspecified: Secondary | ICD-10-CM | POA: Diagnosis not present

## 2019-08-06 DIAGNOSIS — Z8249 Family history of ischemic heart disease and other diseases of the circulatory system: Secondary | ICD-10-CM | POA: Diagnosis not present

## 2019-08-06 DIAGNOSIS — I4891 Unspecified atrial fibrillation: Secondary | ICD-10-CM

## 2019-08-06 MED ORDER — ROSUVASTATIN CALCIUM 10 MG PO TABS
ORAL_TABLET | ORAL | 0 refills | Status: DC
Start: 2019-08-06 — End: 2019-08-06

## 2019-08-06 NOTE — Telephone Encounter (Signed)
Patient called back after his appointment this morning and states that he does not want to take the crestor prescribed at his visit today. He states that he has had reactions to statins in the past that "almost crippled him" and refuses to take statins. Offered for patient to be set up in the [facility] to discuss other options for controlling cholesterol (ie. PCSK9 inhibitors) and patient declined that as well. Lab appointment has been cancelled. Patient will keep appointments for echo, Calcium Score CT, and follow up appointment.

## 2019-08-06 NOTE — Progress Notes (Signed)
Cardiology Office Note   Date:  08/06/2019   ID:  Eric Cordova, DOB 09/18/1950, MRN [id number]  PCP:  Seward Carol, MD    No chief complaint on file.  AFib  Wt Readings from Last 3 Encounters:  08/06/19 (!) 220 lb (99.8 kg)  05/26/19 210 lb (95.3 kg)  09/11/18 209 lb 7 oz (95 kg)       History of Present Illness: Eric Cordova is a 69 y.o. male who is being seen today for the evaluation of AFib at the request of Seward Carol, MD.  Seen in 5/21 for palpitations in the ER.  Records show: "presents the emergency department today for palpitations.  Patient states that everything was normal has not been sick recently and has been eating and drinking normally.  He states he was walking up the steps to go to bed and he felt an acute acute onset of palpitations.  His watch can do some type of EKG and and told he was in atrial fibrillation so he presented to the emergency room for further evaluation.  He states somewhere along the line he has some left lateral chest pain but that has resolved at this point does not feel short of breath.  No fevers.  No lightheadedness.  No other associated symptoms at this time.  He has no history of atrial fibrillation.  No other associated or modifying symptoms."  Started on Eliquis.   Initial ECG showed AFib with RVR.  He was cardioverted to NSR in the ER after a few hours.   He has a smart watch.  No further episodes of AFib.  Watch has called NSR.    Father had an MI at age 41.   Denies :  Dizziness. Leg edema. Nitroglycerin use. Orthopnea. Palpitations. Paroxysmal nocturnal dyspnea. Shortness of breath. Syncope.   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    w/ irritation   Pneumonia     Past  Surgical History:  Procedure Laterality Date   COLON SURGERY     CYSTOSCOPY N/A 12/26/2014   Procedure: CYSTOSCOPY;  Surgeon: Alexis Frock, MD;  Location: [facility];  Service: Urology;  Laterality: N/A;   HERNIA REPAIR  1960, 1974   inguinal bilateral   HYDROCELE EXCISION Right 1984   INGUINAL HERNIA REPAIR Bilateral 1960 & 1974   INSERTION OF MESH  11/07/2011   Procedure: INSERTION OF MESH;  Surgeon: Gwenyth Ober, MD;  Location: [place];  Service: General;  Laterality: N/A;   LAPAROSCOPIC SIGMOID COLECTOMY  11-12-2010   diverticulitis   NASAL SEPTOPLASTY W/ TURBINOPLASTY Bilateral 09/11/2018   Procedure: NASAL SEPTOPLASTY WITH BILATERAL TURBINATE REDUCTION;  Surgeon: Leta Baptist, MD;  Location: [place];  Service: ENT;  Laterality: Bilateral;   [place] N/A 12/26/2014   Procedure: PENILE  SKIN BIOPSY WITH PENILE BLOCK;  Surgeon: Alexis Frock, MD;  Location: [facility];  Service: Urology;  Laterality: N/A;   PILONIDAL CYST EXCISION  1973   TONSILLECTOMY  1958   VENTRAL HERNIA REPAIR  11/07/2011   Procedure: LAPAROSCOPIC VENTRAL HERNIA;  Surgeon: Gwenyth Ober, MD;  Location: [place];  Service:  General;  Laterality: N/A;     Current Outpatient Medications  Medication Sig Dispense Refill   aspirin EC 81 MG tablet Take 81 mg by mouth daily. Swallow whole.     Coenzyme Q10 (COQ-10 PO) Take 1 tablet by mouth daily.     ezetimibe (ZETIA) 10 MG tablet Take 10 mg by mouth daily.     fenofibrate (TRICOR) 145 MG tablet Take 145 mg by mouth daily.     levothyroxine (SYNTHROID, LEVOTHROID) 50 MCG tablet Take 50-75 mcg by mouth daily before breakfast. 1.5 tablet on Monday, Wednesday and Friday; 1 tablet all other days of the week     sodium chloride (MURO 128) 2 % ophthalmic solution Place 1 drop into both eyes every 4 (four) hours as needed for eye irritation.     No current facility-administered  medications for this visit.    Allergies:   Statins    Social History:  The patient  reports that he has never smoked. He has never used smokeless tobacco. He reports current alcohol use of about 7.0 - 14.0 standard drinks of alcohol per week. He reports that he does not use drugs.   Family History:  The patient's family history includes Arthritis in his maternal grandmother; Asthma in his mother; Cancer in his father, maternal uncle, and mother; Dementia in his father; Heart disease in his father; Hypertension in his brother; Thyroid disease in his mother.    ROS:  Please see the history of present illness.   Otherwise, review of systems are positive for palpitations when in AFib.   All other systems are reviewed and negative.    PHYSICAL EXAM: VS:  BP (!) 144/72    Pulse 64    Ht 5\' 7"  (1.702 m)    Wt (!) 220 lb (99.8 kg)    SpO2 96%    BMI 34.46 kg/m  , BMI Body mass index is 34.46 kg/m. GEN: Well nourished, well developed, in no acute distress  HEENT: normal  Neck: no JVD, carotid bruits, or masses Cardiac: RRR; no murmurs, rubs, or gallops,no edema  Respiratory:  clear to auscultation bilaterally, normal work of breathing GI: soft, nontender, nondistended, + BS MS: no deformity or atrophy  Skin: warm and dry, no rash Neuro:  Strength and sensation are intact Psych: euthymic mood, full affect   EKG:   The ekg ordered in ER demonstrates A. fib with RVR initially, followed by normal sinus rhythm   Recent Labs: 05/26/2019: BUN 27; Creatinine, Ser 0.98; Hemoglobin 14.9; Platelets 192; Potassium 3.9; Sodium 140   Lipid Panel    Component Value Date/Time   CHOL 287 (H) 02/12/2015 0818   TRIG 120 02/12/2015 0818   HDL 67 02/12/2015 0818   CHOLHDL 4.3 02/12/2015 0818   VLDL 24 02/12/2015 0818   LDLCALC 196 (H) 02/12/2015 0818     Other studies Reviewed: Additional studies/ records that were reviewed today with results demonstrating: ER records reviewed.   ASSESSMENT AND  PLAN:  1. AFib: First episode noted in 5/21. ELiquis for stroke prevention was never taken, and not prescribed per his report.  Check echo.  Normal TSH in Jan 2021.  Occurred in the setting of more alcohol than usual. Decrease alcohol intake. Rhythm seems to have been stable outside of no one episode. He does check his smart watch frequently.  We discussed stroke risk.  Continue aspirin since CHADs score is only 1. 2. Family h/o CAD: Discussed screening with calcium scoring. He is agreeable.  3. Hyperlipidemia: Continue Zetia and fenofibrate.  He was intolerant of atorvastatin due to muscle pain. Start rosuvastatin 10 mg once a week, but will increase frequency as tolerated. I will try to be aggressive with his lipids given his family history of coronary artery disease. Whole food, plant-based diet will be helpful.   Current medicines are reviewed at length with the patient today.  The patient concerns regarding his medicines were addressed.  The following changes have been made:  No change  Labs/ tests ordered today include:  No orders of the defined types were placed in this encounter.   Recommend 150 minutes/week of aerobic exercise Low fat, low carb, high fiber diet recommended  Disposition:   FU for echo.   SignedLarae Grooms, MD  08/06/2019 11:28 AM    [facility] [place], [place], [place]  [address] Phone: [phone]; Fax: [phone]

## 2019-08-06 NOTE — Patient Instructions (Addendum)
Medication Instructions:  Start crestor (rosuvastatin) 10 mg once a week for two weeks if you tolerate this increase to twice a week. *If you need a refill on your cardiac medications before your next appointment, please call your pharmacy*   Lab Work: Your physician recommends that you return for a FASTING lipid profile and liver function panel in eight weeks.   If you have labs (blood work) drawn today and your tests are completely normal, you will receive your results only by: Marland Kitchen MyChart Message (if you have MyChart) OR . A paper copy in the mail If you have any lab test that is abnormal or we need to change your treatment, we will call you to review the results.   Testing/Procedures: Your physician has requested that you have an echocardiogram. Echocardiography is a painless test that uses sound waves to create images of your heart. It provides your doctor with information about the size and shape of your heart and how well your heart's chambers and valves are working. This procedure takes approximately one hour. There are no restrictions for this procedure.  Your physician has requested that you have a cardiac calcium score CT.   Follow-Up: At [facility], you and your health needs are our priority.  As part of our continuing mission to provide you with exceptional heart care, we have created designated Provider Care Teams.  These Care Teams include your primary Cardiologist (physician) and Advanced Practice Providers (APPs -  Physician Assistants and Nurse Practitioners) who all work together to provide you with the care you need, when you need it.  We recommend signing up for the patient portal called "MyChart".  Sign up information is provided on this After Visit Summary.  MyChart is used to connect with patients for Virtual Visits (Telemedicine).  Patients are able to view lab/test results, encounter notes, upcoming appointments, etc.  Non-urgent messages can be sent to your  provider as well.   To learn more about what you can do with MyChart, go to [email or web address].    Your next appointment:   3 month(s)  The format for your next appointment:   In Person  Provider:   You may see Dr Irish Lack or one of the following Advanced Practice Providers on your designated Care Team:    Melina Copa, PA-C  Ermalinda Barrios, PA-C

## 2019-09-03 ENCOUNTER — Other Ambulatory Visit: Payer: Self-pay | Admitting: Internal Medicine

## 2019-09-03 DIAGNOSIS — R1031 Right lower quadrant pain: Secondary | ICD-10-CM

## 2019-09-12 ENCOUNTER — Ambulatory Visit
Admission: RE | Admit: 2019-09-12 | Discharge: 2019-09-12 | Disposition: A | Discharge status: Home / Self Care | Payer: Medicare Other | Source: Ambulatory Visit | Attending: Internal Medicine | Admitting: Internal Medicine

## 2019-09-12 ENCOUNTER — Other Ambulatory Visit: Payer: Self-pay

## 2019-09-12 DIAGNOSIS — R1031 Right lower quadrant pain: Secondary | ICD-10-CM

## 2019-09-12 MED ORDER — IOPAMIDOL (ISOVUE-300) INJECTION 61%
100.0000 mL | Freq: Once | INTRAVENOUS | Status: AC | PRN
  Administered 2019-09-12: 100 mL via INTRAVENOUS

## 2019-10-03 ENCOUNTER — Telehealth (HOSPITAL_COMMUNITY): Payer: Self-pay | Admitting: Interventional Cardiology

## 2019-10-03 NOTE — Telephone Encounter (Signed)
Patient cancelled echocardiogram due to reason below: Patient (pt is in the middle of open enrollment for his insurance and will cb to r/s) per Angeline Hammer. Order will be removed from the [place] and when patient calls back to schedule we will reinstate the order.

## 2019-10-07 ENCOUNTER — Other Ambulatory Visit (HOSPITAL_COMMUNITY): Payer: Medicare Other

## 2019-10-07 ENCOUNTER — Other Ambulatory Visit: Payer: Medicare Other

## 2019-11-11 ENCOUNTER — Ambulatory Visit: Payer: Medicare Other | Admitting: Interventional Cardiology

## 2020-02-24 DIAGNOSIS — I480 Paroxysmal atrial fibrillation: Secondary | ICD-10-CM | POA: Diagnosis not present

## 2020-02-24 DIAGNOSIS — R002 Palpitations: Secondary | ICD-10-CM | POA: Diagnosis not present

## 2020-03-12 DIAGNOSIS — R002 Palpitations: Secondary | ICD-10-CM | POA: Diagnosis not present

## 2020-03-30 ENCOUNTER — Encounter: Payer: Self-pay | Admitting: Interventional Cardiology

## 2020-03-30 ENCOUNTER — Other Ambulatory Visit: Payer: Self-pay

## 2020-03-30 ENCOUNTER — Ambulatory Visit: Payer: Medicare Other | Admitting: Interventional Cardiology

## 2020-03-30 VITALS — BP 162/94 | HR 60 | Ht 67.0 in | Wt 217.4 lb

## 2020-03-30 DIAGNOSIS — E785 Hyperlipidemia, unspecified: Secondary | ICD-10-CM

## 2020-03-30 DIAGNOSIS — I700 Atherosclerosis of aorta: Secondary | ICD-10-CM | POA: Diagnosis not present

## 2020-03-30 DIAGNOSIS — I4891 Unspecified atrial fibrillation: Secondary | ICD-10-CM

## 2020-03-30 DIAGNOSIS — Z8249 Family history of ischemic heart disease and other diseases of the circulatory system: Secondary | ICD-10-CM | POA: Diagnosis not present

## 2020-03-30 NOTE — Progress Notes (Signed)
Cardiology Office Note   Date:  03/30/2020   ID:  Eric Cordova, DOB 1950-08-22, MRN [id number]  PCP:  Seward Carol, MD    No chief complaint on file.  AFib  Wt Readings from Last 3 Encounters:  03/30/20 217 lb 6.4 oz (98.6 kg)  08/06/19 (!) 220 lb (99.8 kg)  05/26/19 210 lb (95.3 kg)       History of Present Illness: Eric Cordova is a 70 y.o. male who has had atrial fibrillation.  I first saw him in 2021.  In May 2021, he went to the emergency room after feeling the acute onset of palpitations.  His watch was able to inform him of an irregular heart rhythm.  Initial ECG showed atrial fibrillation with rapid ventricular response.  He was cardioverted to normal sinus rhythm in the emergency room after few hours.  He was started on Eliquis, but ultimately had not taken this medicine.  He did start aspirin.  He has made a point to check his watch regularly to see if there is any additional atrial fibrillation.  His father had an MI at age 42.  Since the last visit, he has not had sx of AFib.  HR goes to 90s randomly, but watch says it is not AFib, and it is not as severe as his prior AFib.  His PMD ordered a Zio patch but we do not have the result. His watch has shown NSR and "inconclusive."  He was told he had PVCs.   He is afraid of shots but will consider PCSK9 inhibitor.    Past Medical History:  Diagnosis Date  . Asthma    as child  . Deviated septum   . Diverticulosis of colon   . History of diverticulitis of colon   . Hyperlipidemia   . Hypothyroidism   . 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 Pneumonia     Past Surgical History:  Procedure Laterality Date  . COLON SURGERY    . CYSTOSCOPY N/A 12/26/2014   Procedure: CYSTOSCOPY;  Surgeon: Alexis Frock, MD;  Location: [facility];  Service: Urology;  Laterality: N/A;  . [place]   inguinal bilateral  . HYDROCELE EXCISION Right 1984  .  INGUINAL HERNIA REPAIR Bilateral 1960 & 1974  . INSERTION OF MESH  11/07/2011   Procedure: INSERTION OF MESH;  Surgeon: Gwenyth Ober, MD;  Location: [place];  Service: General;  Laterality: N/A;  . LAPAROSCOPIC SIGMOID COLECTOMY  11-12-2010   diverticulitis  . NASAL SEPTOPLASTY W/ TURBINOPLASTY Bilateral 09/11/2018   Procedure: NASAL SEPTOPLASTY WITH BILATERAL TURBINATE REDUCTION;  Surgeon: Leta Baptist, MD;  Location: [place];  Service: ENT;  Laterality: Bilateral;  . NASAL SEPTUM SURGERY  1982  . PENILE BIOPSY N/A 12/26/2014   Procedure: PENILE  SKIN BIOPSY WITH PENILE BLOCK;  Surgeon: Alexis Frock, MD;  Location: [facility];  Service: Urology;  Laterality: N/A;  . PILONIDAL CYST EXCISION  1973  . TONSILLECTOMY  1958  . VENTRAL HERNIA REPAIR  11/07/2011   Procedure: LAPAROSCOPIC VENTRAL HERNIA;  Surgeon: Gwenyth Ober, MD;  Location: [place];  Service: General;  Laterality: N/A;     Current Outpatient Medications  Medication Sig Dispense Refill  . aspirin EC 81 MG tablet Take 81 mg by mouth daily. Swallow whole.    . Coenzyme Q10 (COQ-10 PO) Take 1 tablet by mouth daily.    Marland Kitchen  diphenhydrAMINE HCl (ZZZQUIL) 50 MG/30ML LIQD Take 30 mLs by mouth at bedtime.    Marland Kitchen ezetimibe (ZETIA) 10 MG tablet Take 10 mg by mouth daily.    . fenofibrate (TRICOR) 145 MG tablet Take 145 mg by mouth daily.    Marland Kitchen levothyroxine (SYNTHROID, LEVOTHROID) 50 MCG tablet Take 50-75 mcg by mouth daily before breakfast. 1.5 tablet on Monday, Wednesday and Friday; 1 tablet all other days of the week    . sildenafil (VIAGRA) 25 MG tablet 1 tablet as needed    . sodium chloride (MURO 128) 2 % ophthalmic solution Place 1 drop into both eyes every 4 (four) hours as needed for eye irritation.     No current facility-administered medications for this visit.    Allergies:   Statins    Social History:  The patient  reports that he has never smoked. He has never used smokeless tobacco. He reports  current alcohol use of about 7.0 - 14.0 standard drinks of alcohol per week. He reports that he does not use drugs.   Family History:  The patient's family history includes Arthritis in his maternal grandmother; Asthma in his mother; Cancer in his father, maternal uncle, and mother; Dementia in his father; Heart disease in his father; Hypertension in his brother; Thyroid disease in his mother.    ROS:  Please see the history of present illness.   Otherwise, review of systems are positive for palpitations.   All other systems are reviewed and negative.    PHYSICAL EXAM: VS:  BP (!) 162/94   Pulse 60   Ht 5\' 7"  (1.702 m)   Wt 217 lb 6.4 oz (98.6 kg)   SpO2 98%   BMI 34.05 kg/m  , BMI Body mass index is 34.05 kg/m. GEN: Well nourished, well developed, in no acute distress  HEENT: normal  Neck: no JVD, carotid bruits, or masses Cardiac: RRR; no murmurs, rubs, or gallops,no edema  Respiratory:  clear to auscultation bilaterally, normal work of breathing GI: soft, nontender, nondistended, + BS MS: no deformity or atrophy  Skin: warm and dry, no rash Neuro:  Strength and sensation are intact Psych: euthymic mood, full affect   EKG:   The ekg ordered at PCP demonstrates sinus rhythm, T wave flattening  Recent Labs: 05/26/2019: BUN 27; Creatinine, Ser 0.98; Hemoglobin 14.9; Platelets 192; Potassium 3.9; Sodium 140   Lipid Panel    Component Value Date/Time   CHOL 287 (H) 02/12/2015 0818   TRIG 120 02/12/2015 0818   HDL 67 02/12/2015 0818   CHOLHDL 4.3 02/12/2015 0818   VLDL 24 02/12/2015 0818   LDLCALC 196 (H) 02/12/2015 0818     Other studies Reviewed: Additional studies/ records that were reviewed today with results demonstrating: we reviewed his home ECG readings from his phone- there is artifact on some tracings.   ASSESSMENT AND PLAN:  1. Atrial fibrillation: Has not had sx of AFib.  HR goes to 90s randomly, but watch says it is not AFib.  We reviewed his tracings and  there were none that I saw that were AFib since May 2021.  2. Family history of CAD: Coronary calcium score CT was scheduled but I do not have the results.  Mild aortic atherosclerotic calcifications are noted on his PE protocol CT.   3. Aortic atherosclerosis/hyperlipidemia: Statin intolerant.  He continues with ezetimibe and fenofibrate. 4. Elevated BP reading in the office. Normal at home. 5. Hyperlipidemia: TC 244, LDL 154, TG 132, HDL 66.  Refer  to lipid clinic.    Current medicines are reviewed at length with the patient today.  The patient concerns regarding his medicines were addressed.  The following changes have been made:  No change  Labs/ tests ordered today include:  No orders of the defined types were placed in this encounter.   Recommend 150 minutes/week of aerobic exercise Low fat, low carb, high fiber diet recommended  Disposition:   FU in 1 year   Signed, Larae Grooms, MD  03/30/2020 4:38 PM    [facility] [place], [place], [place]  [address] Phone: [phone]; Fax: [phone]

## 2020-03-30 NOTE — Patient Instructions (Signed)
Medication Instructions:  Your physician recommends that you continue on your current medications as directed. Please refer to the Current Medication list given to you today.  *If you need a refill on your cardiac medications before your next appointment, please call your pharmacy*   Lab Work: none If you have labs (blood work) drawn today and your tests are completely normal, you will receive your results only by: Marland Kitchen MyChart Message (if you have MyChart) OR . A paper copy in the mail If you have any lab test that is abnormal or we need to change your treatment, we will call you to review the results.   Testing/Procedures: Dr Irish Lack recommends you have a calcium score ct scan   Follow-Up: At [facility], you and your health needs are our priority.  As part of our continuing mission to provide you with exceptional heart care, we have created designated Provider Care Teams.  These Care Teams include your primary Cardiologist (physician) and Advanced Practice Providers (APPs -  Physician Assistants and Nurse Practitioners) who all work together to provide you with the care you need, when you need it.  We recommend signing up for the patient portal called "MyChart".  Sign up information is provided on this After Visit Summary.  MyChart is used to connect with patients for Virtual Visits (Telemedicine).  Patients are able to view lab/test results, encounter notes, upcoming appointments, etc.  Non-urgent messages can be sent to your provider as well.   To learn more about what you can do with MyChart, go to [email or web address].    Your next appointment:   12 month(s)  The format for your next appointment:   In Person  Provider:   You may see Larae Grooms, MD or one of the following Advanced Practice Providers on your designated Care Team:    Melina Copa, PA-C  Ermalinda Barrios, PA-C    Other Instructions You have been referred to the lipid clinic in the [facility] office.   Please schedule patient for new patient appointment

## 2020-04-06 ENCOUNTER — Other Ambulatory Visit: Payer: Self-pay

## 2020-04-06 ENCOUNTER — Ambulatory Visit (INDEPENDENT_AMBULATORY_CARE_PROVIDER_SITE_OTHER): Payer: Medicare Other | Admitting: Pharmacy Technician

## 2020-04-06 DIAGNOSIS — E785 Hyperlipidemia, unspecified: Secondary | ICD-10-CM | POA: Diagnosis not present

## 2020-04-06 MED ORDER — ROSUVASTATIN CALCIUM 10 MG PO TABS
10.0000 mg | ORAL_TABLET | Freq: Every day | ORAL | 3 refills | Status: DC
Start: 2020-04-06 — End: 2020-05-25

## 2020-04-06 NOTE — Patient Instructions (Addendum)
It was great seeing you today!  Your LDL is above your goal of <70 so today we are prescribing rosuvastatin 10 mg daily. We will recheck a lipid panel and see you again in clinic in 2-3 months. We will give you a call in a few days to schedule. Please call us if you notice any symptoms of muscle pain.

## 2020-04-06 NOTE — Progress Notes (Signed)
Patient ID: Eric Cordova                 DOB: Jun 20, 1950                    MRN: [id number]     HPI: Eric Cordova is a 70 y.o. male patient referred to lipid clinic by Dr. Irish Lack. PMH is significant for atrial fibrillation, hyperlipidemia, and aortic atherosclerosis. Patient has coronary calcium scoring CT scheduled for April 2022.   Patient was last seen by Dr. Irish Lack on 3/21 for atrial fibrillation. Patient had been prescribed rosuvastatin but noted his pharmacy refused to fill due to statins being listed as an allergy. Most recent lipid panel (09/03/2019) TC 244, LDL 154, TG 132, HDL 66. Patient noted he is afraid of shots but would consider PCSK9.  Patient presents to lipid clinic for initial visit. Patient endorsed making many lifestyle changes since his last lipid panel in August 2021 including eating more of a plant based diet and walking more frequently. In discussing previous statin intolerances, patient endorsed muscle pain when previously on Lipitor that resolved when patient stopped taking.   Current Medications: ezetimibe 10 mg daily Intolerances: atorvastatin (muscle pain, "almost crippled him", couldn't walk up the steps) Risk Factors: family history, LDL LDL goal: <70  Diet: endorses positive changes to a more plant based diet including cauliflower burgers and when eating meat choosing salmon 2-3 times per week and organic chicken  Exercise: endorses more movement including walking and being more active at his job  Family History: father - MI at age 53, brother - hypertension  Social History: denies smoking or drug use, reports 7-14 drinks of alcohol per week  Labs: 09/03/2019 TC 244, LDL 154, TG 132, HDL 66 (ezetimibe 10mg  daily)  Past Medical History:  Diagnosis Date  . Asthma    as child  . Deviated septum   . Diverticulosis of colon   . History of diverticulitis of colon   . Hyperlipidemia   . Hypothyroidism   . Neoplasm of uncertain behavior of  glans penis    w/ irritation  . Pneumonia     Current Outpatient Medications on File Prior to Visit  Medication Sig Dispense Refill  . aspirin EC 81 MG tablet Take 81 mg by mouth daily. Swallow whole.    . Coenzyme Q10 (COQ-10 PO) Take 1 tablet by mouth daily.    . diphenhydrAMINE HCl (ZZZQUIL) 50 MG/30ML LIQD Take 30 mLs by mouth at bedtime.    Marland Kitchen ezetimibe (ZETIA) 10 MG tablet Take 10 mg by mouth daily.    . fenofibrate (TRICOR) 145 MG tablet Take 145 mg by mouth daily.    Marland Kitchen levothyroxine (SYNTHROID, LEVOTHROID) 50 MCG tablet Take 50-75 mcg by mouth daily before breakfast. 1.5 tablet on Monday, Wednesday and Friday; 1 tablet all other days of the week    . sildenafil (VIAGRA) 25 MG tablet 1 tablet as needed    . sodium chloride (MURO 128) 2 % ophthalmic solution Place 1 drop into both eyes every 4 (four) hours as needed for eye irritation.     No current facility-administered medications on file prior to visit.    Allergies  Allergen Reactions  . Statins Other (See Comments)    Rapid heart beat and weakness     Assessment/Plan:  1. Hyperlipidemia - LDL is above goal of <70 on ezetimibe 10 mg daily based on lipid panel from August of 2021 with LDL 154. Since that time patient  has made many lifestyle changes that should positively impact his LDL. In discussing differences in statins, noted that atorvastatin is more lipophilic and can cause more myalgias in patients compared to rosuvastatin which is more hydrophilic. Patient was agreeable to a trial of rosuvastatin 10 mg daily and following up in lipid clinic in 2-3 months to check his lipid panel and LFTs. Updated patient's allergies to be specific to atorvastatin. Will call patient in a few days to schedule lab and lipid clinic appointment in 2-3 months when patient has his schedule.    Romilda Garret, PharmD PGY1 Acute Care Pharmacy Resident 04/06/2020 11:16 AM

## 2020-04-27 ENCOUNTER — Ambulatory Visit (INDEPENDENT_AMBULATORY_CARE_PROVIDER_SITE_OTHER)
Admission: RE | Admit: 2020-04-27 | Discharge: 2020-04-27 | Disposition: A | Discharge status: Home / Self Care | Payer: Self-pay | Source: Ambulatory Visit | Attending: Interventional Cardiology | Admitting: Interventional Cardiology

## 2020-04-27 ENCOUNTER — Other Ambulatory Visit: Payer: Self-pay

## 2020-04-27 DIAGNOSIS — E785 Hyperlipidemia, unspecified: Secondary | ICD-10-CM

## 2020-05-01 ENCOUNTER — Telehealth: Payer: Self-pay | Admitting: *Deleted

## 2020-05-01 DIAGNOSIS — E785 Hyperlipidemia, unspecified: Secondary | ICD-10-CM

## 2020-05-01 NOTE — Telephone Encounter (Signed)
-----   Message from Jettie Booze, MD sent at 04/30/2020  9:24 PM EDT ----- Elevated calcium score, > 100.  Needs aggressive lipid lowering to lower his risk given family history.  Will refer to lipid clinic to see what his options are since he has been intolerant to statin therapy.  I will see him back in 6 months or sooner if he has any CP or SHOB.

## 2020-05-01 NOTE — Telephone Encounter (Signed)
Spoke to patient about elevated calcium score and the need to refer to lipid clinic, patient agreeable.  Referral sent to lipid clinic

## 2020-05-12 ENCOUNTER — Telehealth: Payer: Self-pay | Admitting: *Deleted

## 2020-05-12 NOTE — Telephone Encounter (Signed)
No AFib noted on monitor or ECG from [place].  JV  Monitor and EKG to be scanned into chart

## 2020-05-12 NOTE — Progress Notes (Signed)
No AFib noted on monitor or ECG from [place].  JV

## 2020-05-25 ENCOUNTER — Ambulatory Visit (INDEPENDENT_AMBULATORY_CARE_PROVIDER_SITE_OTHER): Payer: Medicare Other | Admitting: Pharmacist

## 2020-05-25 ENCOUNTER — Other Ambulatory Visit: Payer: Self-pay

## 2020-05-25 DIAGNOSIS — E785 Hyperlipidemia, unspecified: Secondary | ICD-10-CM | POA: Diagnosis not present

## 2020-05-25 DIAGNOSIS — Z1211 Encounter for screening for malignant neoplasm of colon: Secondary | ICD-10-CM | POA: Insufficient documentation

## 2020-05-25 DIAGNOSIS — I480 Paroxysmal atrial fibrillation: Secondary | ICD-10-CM | POA: Insufficient documentation

## 2020-05-25 DIAGNOSIS — Z6834 Body mass index (BMI) 34.0-34.9, adult: Secondary | ICD-10-CM | POA: Insufficient documentation

## 2020-05-25 DIAGNOSIS — R3915 Urgency of urination: Secondary | ICD-10-CM | POA: Insufficient documentation

## 2020-05-25 MED ORDER — ROSUVASTATIN CALCIUM 20 MG PO TABS: 20.0000 mg | ORAL_TABLET | Freq: Every day | ORAL | 0 refills | Status: DC

## 2020-05-25 NOTE — Progress Notes (Signed)
Patient ID: Eric Cordova                 DOB: 1950-06-17                    MRN: [id number]     HPI: Eric Cordova is a 70 y.o. male patient referred to lipid clinic by Dr Irish Lack. PMH is significant for HTN, asthma, HLD and an incident of A Fib during a period of stress over a year ago.  Patient presents today in good spirits.  Is tolerating rosuvastatin 10mg  and Zetia 10mg  daily without any side effects. Has follow up lab work already ordered for late June, early July which he will get at [facility] in [place].  Patient is very nervous of needles and works for [employer] so is aware of the prices of [place].  He is concerned these medications will push him into the donut hole and he would not be able to qualify for any of the grant programs due to income.    Follows a Mediterranean diet.  Eats vegetables, fruits, lean proteins, whole grains, and drinks a glass of red wine nightly. Owns a Peloton exercise bike but has been scared to use it due to fear of A Fib.  Current Medications: rosuvastatin 10 mg, Zetia 10mg , fenofibrate 145mg  daily Intolerances: atorvastatin  Risk Factors: family history, elevated coronary calcium LDL goal: <70  Diet: drinks 8-10 glasses of water daily, 1 glass of wine per night  Family History: Father: HLD, MI.  Mother: cancer  Social History: no tobacco  Labs:09/03/2019 TC 244, LDL 154, TG 132, HDL 66 (ezetimibe 10mg  daily, rosuvastatin 10mg  daily)  Past Medical History:  Diagnosis Date  . Asthma    as child  . Deviated septum   . Diverticulosis of colon   . History of diverticulitis of colon   . Hyperlipidemia   . Hypothyroidism   . 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 Pneumonia     Current Outpatient Medications on File Prior to Visit  Medication Sig Dispense Refill  . aspirin EC 81 MG tablet Take 81 mg by mouth daily. Swallow whole.    . Coenzyme Q10 (COQ-10 PO) Take 1 tablet by mouth  daily.    . diphenhydrAMINE HCl (ZZZQUIL) 50 MG/30ML LIQD Take 30 mLs by mouth at bedtime.    Marland Kitchen ezetimibe (ZETIA) 10 MG tablet Take 10 mg by mouth daily.    . fenofibrate (TRICOR) 145 MG tablet Take 145 mg by mouth daily.    Marland Kitchen levothyroxine (SYNTHROID, LEVOTHROID) 50 MCG tablet Take 50-75 mcg by mouth daily before breakfast. 1.5 tablet on Monday, Wednesday and Friday; 1 tablet all other days of the week    . rosuvastatin (CRESTOR) 10 MG tablet Take 1 tablet (10 mg total) by mouth daily. 90 tablet 3  . sildenafil (VIAGRA) 25 MG tablet 1 tablet as needed    . sodium chloride (MURO 128) 2 % ophthalmic solution Place 1 drop into both eyes every 4 (four) hours as needed for eye irritation.     No current facility-administered medications on file prior to visit.    Allergies  Allergen Reactions  . Atorvastatin Other (See Comments)    Muscle pain    Assessment/Plan:  1. Hyperlipidemia - Patient LDL 154 which is above goal of <70.  Would benefit from increasing rosuvastatin since he has been tolerating and increasing physical activity.  Next step would be Nexletol  or Repatha.  Patient agreeable to starting rosuvastatin 20mg  once daily and checking lab work in late June.  Recommended patient begin becoming more physcially active up to 30 minutes a day up to 5 days a week.  Continue heart healthy Mediterranean diet.  Patient voiced understanding.  Increase rosuvastatin to 20mg  daily Continue fenofibrate 145mg  daily Continue Zetia 10mg  daily Recheck as needed  Karren Cobble, PharmD, BCACP, [place], [place] [address] [place], [place] [address] Phone: [phone]; Fax: [phone] 05/25/2020 4:58 PM

## 2020-05-25 NOTE — Patient Instructions (Addendum)
It was nice meeting you today!  We would like your LDL (bad cholesterol) to be less than 70  We will increase your rosuvastatin to 20mg  once daily  Please continue your heart healthy diet  Try to increase your physical activity up to 30 minutes a day 5 days a week  We will check your lab work in late June/early July  Please call with any questions or problems  Karren Cobble, PharmD, BCACP, [place], [place] [address], [place], [place] [address] Phone: [phone]; Fax: [phone] 05/25/2020 1:52 PM

## 2020-07-17 DIAGNOSIS — Z20822 Contact with and (suspected) exposure to covid-19: Secondary | ICD-10-CM | POA: Diagnosis not present

## 2020-09-17 ENCOUNTER — Other Ambulatory Visit: Payer: Self-pay | Admitting: Interventional Cardiology

## 2020-09-17 DIAGNOSIS — E785 Hyperlipidemia, unspecified: Secondary | ICD-10-CM

## 2020-09-17 NOTE — Telephone Encounter (Signed)
Pt calling requesting a refill on rosuvastatin. LOV with you stated that pt needed to get labs done. Please address

## 2020-09-18 ENCOUNTER — Telehealth: Payer: Self-pay | Admitting: Interventional Cardiology

## 2020-09-18 NOTE — Telephone Encounter (Signed)
I spoke with patient. Rosuvastatin was increased earlier this year at lipid clinic appointment.  Patient was to have lab work in June.  He missed lab work and is now almost out of Rosuvastatin.  He ran out of 20 mg tablets and has been taking 2 of the 10 mg tablets daily.  Has not missed any doses.  He is seeing PCP on 9/19 and lab work will be done at that time. He will have results sent to our office.  I told patient I would send 90 day supply of Rosuvastatin 20 mg to his pharmacy and further refills would be based on lab results.

## 2020-09-18 NOTE — Telephone Encounter (Signed)
pt would like to speak to Dr. Hassell Done nurse, refused to provide additional info... please advise

## 2020-09-18 NOTE — Telephone Encounter (Signed)
See phone note from today.

## 2020-09-28 DIAGNOSIS — I480 Paroxysmal atrial fibrillation: Secondary | ICD-10-CM | POA: Diagnosis not present

## 2020-09-28 DIAGNOSIS — M7989 Other specified soft tissue disorders: Secondary | ICD-10-CM | POA: Diagnosis not present

## 2020-09-28 DIAGNOSIS — L219 Seborrheic dermatitis, unspecified: Secondary | ICD-10-CM | POA: Diagnosis not present

## 2020-09-28 DIAGNOSIS — Z0000 Encounter for general adult medical examination without abnormal findings: Secondary | ICD-10-CM | POA: Diagnosis not present

## 2020-09-28 DIAGNOSIS — E7800 Pure hypercholesterolemia, unspecified: Secondary | ICD-10-CM | POA: Diagnosis not present

## 2020-09-28 DIAGNOSIS — E039 Hypothyroidism, unspecified: Secondary | ICD-10-CM | POA: Diagnosis not present

## 2020-10-04 DIAGNOSIS — W19XXXA Unspecified fall, initial encounter: Secondary | ICD-10-CM | POA: Diagnosis not present

## 2020-10-04 DIAGNOSIS — M25521 Pain in right elbow: Secondary | ICD-10-CM | POA: Diagnosis not present

## 2020-10-04 DIAGNOSIS — S53491A Other sprain of right elbow, initial encounter: Secondary | ICD-10-CM | POA: Diagnosis not present

## 2020-10-06 DIAGNOSIS — A084 Viral intestinal infection, unspecified: Secondary | ICD-10-CM | POA: Diagnosis not present

## 2020-10-07 ENCOUNTER — Other Ambulatory Visit: Payer: Self-pay | Admitting: Orthopedic Surgery

## 2020-10-07 DIAGNOSIS — S46211A Strain of muscle, fascia and tendon of other parts of biceps, right arm, initial encounter: Secondary | ICD-10-CM | POA: Diagnosis not present

## 2020-10-07 DIAGNOSIS — M25521 Pain in right elbow: Secondary | ICD-10-CM

## 2020-10-08 ENCOUNTER — Other Ambulatory Visit: Payer: Self-pay

## 2020-10-08 ENCOUNTER — Ambulatory Visit
Admission: RE | Admit: 2020-10-08 | Discharge: 2020-10-08 | Disposition: A | Discharge status: Home / Self Care | Payer: Medicare Other | Source: Ambulatory Visit | Attending: Orthopedic Surgery | Admitting: Orthopedic Surgery

## 2020-10-08 DIAGNOSIS — S46211A Strain of muscle, fascia and tendon of other parts of biceps, right arm, initial encounter: Secondary | ICD-10-CM | POA: Diagnosis not present

## 2020-10-08 DIAGNOSIS — R197 Diarrhea, unspecified: Secondary | ICD-10-CM | POA: Diagnosis not present

## 2020-10-08 DIAGNOSIS — M25521 Pain in right elbow: Secondary | ICD-10-CM | POA: Diagnosis not present

## 2020-10-14 ENCOUNTER — Other Ambulatory Visit: Payer: Self-pay | Admitting: *Deleted

## 2020-10-14 ENCOUNTER — Telehealth: Payer: Self-pay | Admitting: *Deleted

## 2020-10-14 ENCOUNTER — Encounter: Payer: Self-pay | Admitting: Interventional Cardiology

## 2020-10-14 DIAGNOSIS — S46211D Strain of muscle, fascia and tendon of other parts of biceps, right arm, subsequent encounter: Secondary | ICD-10-CM | POA: Diagnosis not present

## 2020-10-14 DIAGNOSIS — I4891 Unspecified atrial fibrillation: Secondary | ICD-10-CM

## 2020-10-14 DIAGNOSIS — I700 Atherosclerosis of aorta: Secondary | ICD-10-CM

## 2020-10-14 NOTE — Telephone Encounter (Addendum)
   Name: Eric Cordova  DOB: 07-02-1950  MRN: [id number]   Primary Cardiologist: Larae Grooms, MD  Chart reviewed as part of pre-operative protocol coverage. Patient was contacted 10/14/2020 in reference to pre-operative risk assessment for pending surgery as outlined below.  Eric Cordova was last seen on 03/2020 by Dr. Irish Lack, chart reviewed. Remote hx of atrial fib and elevated calcium score noted. No clinical hx of obstructive CAD and no prior echo on file. I reached out to patient for update on how he is doing. The patient affirms he has been doing well without any new cardiac symptoms. Up until recent bicep issue, he was exercise in Peloton several times weekly without any cardiac symptoms. Therefore, based on ACC/AHA guidelines, the patient would be at acceptable risk for the planned procedure without further cardiovascular testing. The patient was advised that if he develops new symptoms prior to surgery to contact our office to arrange for a follow-up visit, and he verbalized understanding.  At this time there is no cardiac contraindication to interrupting aspirin therapy - may hold if needed for procedure.   I also reached out to Dr. Irish Lack about whether the patient would benefit from baseline echocardiogram - this was suggested in 07/2019 but had not been completed. Dr. Irish Lack agrees the patient should have this given history of atrial fib, but does not think it needs to be done prior to surgery. The patient is agreeable to proceeding so I will route to Dr. Hassell Done nurse to help schedule this. The patient would like to defer this until after his surgery date which is TBD.  I will route this recommendation to the requesting party via Epic fax function and remove from pre-op pool. Please call with questions.  Charlie Pitter, PA-C 10/14/2020, 1:51 PM

## 2020-10-14 NOTE — Telephone Encounter (Signed)
error 

## 2020-10-14 NOTE — Telephone Encounter (Signed)
   [facility] Pre-operative Risk Assessment    Patient Name: Bransen Fassnacht  DOB: November 05, 1950 MRN: [id number]  HEARTCARE STAFF:  - IMPORTANT!!!!!! Under Visit Info/Reason for Call, type in Other and utilize the format Clearance MM/DD/YY or Clearance TBD. Do not use dashes or single digits. - Please review there is not already an duplicate clearance open for this procedure. - If request is for dental extraction, please clarify the # of teeth to be extracted. - If the patient is currently at the dentist's office, call Pre-Op Callback Staff (MA/nurse) to input urgent request.  - If the patient is not currently in the dentist office, please route to the Pre-Op pool.  Request for surgical clearance:  What type of surgery is being performed?  RT DISTAL BICEPS REPAIR  When is this surgery scheduled?  TBD  What type of clearance is required (medical clearance vs. Pharmacy clearance to hold med vs. Both)?  BOTH  Are there any medications that need to be held prior to surgery and how long?  ASPIRIN  Practice name and name of physician performing surgery?  [facility] / DR. VARKEY  What is the office phone number?   [phone]   7.   What is the office fax number?  [phone] ATTN:  KELLY  8.   Anesthesia type (None, local, MAC, general) ?     Jeanann Lewandowsky 10/14/2020, 12:59 PM  _________________________________________________________________   (provider comments below)

## 2020-10-20 DIAGNOSIS — S46211D Strain of muscle, fascia and tendon of other parts of biceps, right arm, subsequent encounter: Secondary | ICD-10-CM | POA: Diagnosis not present

## 2020-10-26 DIAGNOSIS — X58XXXA Exposure to other specified factors, initial encounter: Secondary | ICD-10-CM | POA: Diagnosis not present

## 2020-10-26 DIAGNOSIS — S46211A Strain of muscle, fascia and tendon of other parts of biceps, right arm, initial encounter: Secondary | ICD-10-CM | POA: Diagnosis not present

## 2020-10-26 DIAGNOSIS — Y999 Unspecified external cause status: Secondary | ICD-10-CM | POA: Diagnosis not present

## 2020-11-02 ENCOUNTER — Telehealth: Payer: Self-pay | Admitting: *Deleted

## 2020-11-02 NOTE — Telephone Encounter (Signed)
Message Received: 3 days ago Jettie Booze, MD  Thompson Grayer, RN; Nuala Alpha, LPN Lipids well controlled. Continue current meds             Specimen Collected: 09/28/20 Last Resulted: 09/28/20 13:01     View Encounter Conversation        Scans on Order [id number]  Scan on 10/09/2020  1:01 PM by Rise PatienceSadie Haber IM at [facility]   Result Notes  Jettie Booze, MD  10/09/2020  1:18 PM EDT Back to Top    Liver and renal function were fine. No cholesterol result available.

## 2020-11-02 NOTE — Telephone Encounter (Signed)
Left the pt a detailed message on his confirmed VM that per Dr. Irish Lack, his lipids are well controlled and renal and liver function were fine.  Left a detailed message for the pt to continue his current med regimen, per Dr. Irish Lack.  Left a detailed message for the pt to call the office back with any additional questions or concerns regarding message left.

## 2020-11-03 DIAGNOSIS — S46211D Strain of muscle, fascia and tendon of other parts of biceps, right arm, subsequent encounter: Secondary | ICD-10-CM | POA: Diagnosis not present

## 2020-11-05 DIAGNOSIS — M25621 Stiffness of right elbow, not elsewhere classified: Secondary | ICD-10-CM | POA: Diagnosis not present

## 2020-11-05 DIAGNOSIS — M6281 Muscle weakness (generalized): Secondary | ICD-10-CM | POA: Diagnosis not present

## 2020-11-12 DIAGNOSIS — M25621 Stiffness of right elbow, not elsewhere classified: Secondary | ICD-10-CM | POA: Diagnosis not present

## 2020-11-12 DIAGNOSIS — M6281 Muscle weakness (generalized): Secondary | ICD-10-CM | POA: Diagnosis not present

## 2020-11-23 DIAGNOSIS — Z961 Presence of intraocular lens: Secondary | ICD-10-CM | POA: Diagnosis not present

## 2020-11-23 DIAGNOSIS — H43812 Vitreous degeneration, left eye: Secondary | ICD-10-CM | POA: Diagnosis not present

## 2020-11-23 DIAGNOSIS — H353131 Nonexudative age-related macular degeneration, bilateral, early dry stage: Secondary | ICD-10-CM | POA: Diagnosis not present

## 2020-11-24 DIAGNOSIS — M25621 Stiffness of right elbow, not elsewhere classified: Secondary | ICD-10-CM | POA: Diagnosis not present

## 2020-11-24 DIAGNOSIS — M6281 Muscle weakness (generalized): Secondary | ICD-10-CM | POA: Diagnosis not present

## 2020-11-30 DIAGNOSIS — M25621 Stiffness of right elbow, not elsewhere classified: Secondary | ICD-10-CM | POA: Diagnosis not present

## 2020-11-30 DIAGNOSIS — S46211D Strain of muscle, fascia and tendon of other parts of biceps, right arm, subsequent encounter: Secondary | ICD-10-CM | POA: Diagnosis not present

## 2020-11-30 DIAGNOSIS — M6281 Muscle weakness (generalized): Secondary | ICD-10-CM | POA: Diagnosis not present

## 2020-12-07 DIAGNOSIS — S46211D Strain of muscle, fascia and tendon of other parts of biceps, right arm, subsequent encounter: Secondary | ICD-10-CM | POA: Diagnosis not present

## 2020-12-07 DIAGNOSIS — M6281 Muscle weakness (generalized): Secondary | ICD-10-CM | POA: Diagnosis not present

## 2020-12-07 DIAGNOSIS — M25621 Stiffness of right elbow, not elsewhere classified: Secondary | ICD-10-CM | POA: Diagnosis not present

## 2020-12-08 DIAGNOSIS — S46211D Strain of muscle, fascia and tendon of other parts of biceps, right arm, subsequent encounter: Secondary | ICD-10-CM | POA: Diagnosis not present

## 2020-12-22 ENCOUNTER — Encounter: Payer: Self-pay | Admitting: Interventional Cardiology

## 2020-12-22 ENCOUNTER — Ambulatory Visit (HOSPITAL_COMMUNITY): Payer: Medicare Other | Attending: Interventional Cardiology

## 2020-12-22 ENCOUNTER — Other Ambulatory Visit: Payer: Self-pay

## 2020-12-22 DIAGNOSIS — I4891 Unspecified atrial fibrillation: Secondary | ICD-10-CM | POA: Diagnosis not present

## 2020-12-22 DIAGNOSIS — I700 Atherosclerosis of aorta: Secondary | ICD-10-CM | POA: Insufficient documentation

## 2020-12-22 LAB — ECHOCARDIOGRAM COMPLETE
Area-P 1/2: 2.02 cm2
P 1/2 time: 1118 msec
S' Lateral: 2.8 cm

## 2020-12-24 DIAGNOSIS — M6281 Muscle weakness (generalized): Secondary | ICD-10-CM | POA: Diagnosis not present

## 2020-12-24 DIAGNOSIS — S46211D Strain of muscle, fascia and tendon of other parts of biceps, right arm, subsequent encounter: Secondary | ICD-10-CM | POA: Diagnosis not present

## 2020-12-24 DIAGNOSIS — M25621 Stiffness of right elbow, not elsewhere classified: Secondary | ICD-10-CM | POA: Diagnosis not present

## 2020-12-30 DIAGNOSIS — M6281 Muscle weakness (generalized): Secondary | ICD-10-CM | POA: Diagnosis not present

## 2020-12-30 DIAGNOSIS — M25621 Stiffness of right elbow, not elsewhere classified: Secondary | ICD-10-CM | POA: Diagnosis not present

## 2020-12-30 DIAGNOSIS — S46211D Strain of muscle, fascia and tendon of other parts of biceps, right arm, subsequent encounter: Secondary | ICD-10-CM | POA: Diagnosis not present

## 2021-01-01 ENCOUNTER — Other Ambulatory Visit: Payer: Self-pay | Admitting: Interventional Cardiology

## 2021-01-01 DIAGNOSIS — E785 Hyperlipidemia, unspecified: Secondary | ICD-10-CM

## 2021-01-05 DIAGNOSIS — S46211D Strain of muscle, fascia and tendon of other parts of biceps, right arm, subsequent encounter: Secondary | ICD-10-CM | POA: Diagnosis not present

## 2021-01-06 DIAGNOSIS — M25621 Stiffness of right elbow, not elsewhere classified: Secondary | ICD-10-CM | POA: Diagnosis not present

## 2021-01-06 DIAGNOSIS — M6281 Muscle weakness (generalized): Secondary | ICD-10-CM | POA: Diagnosis not present

## 2021-01-06 DIAGNOSIS — S46211D Strain of muscle, fascia and tendon of other parts of biceps, right arm, subsequent encounter: Secondary | ICD-10-CM | POA: Diagnosis not present

## 2021-01-21 DIAGNOSIS — M6281 Muscle weakness (generalized): Secondary | ICD-10-CM | POA: Diagnosis not present

## 2021-01-21 DIAGNOSIS — M25621 Stiffness of right elbow, not elsewhere classified: Secondary | ICD-10-CM | POA: Diagnosis not present

## 2021-01-21 DIAGNOSIS — S46211D Strain of muscle, fascia and tendon of other parts of biceps, right arm, subsequent encounter: Secondary | ICD-10-CM | POA: Diagnosis not present

## 2021-01-25 DIAGNOSIS — B372 Candidiasis of skin and nail: Secondary | ICD-10-CM | POA: Diagnosis not present

## 2021-01-25 DIAGNOSIS — I4891 Unspecified atrial fibrillation: Secondary | ICD-10-CM | POA: Diagnosis not present

## 2021-01-27 NOTE — Progress Notes (Signed)
Cardiology Office Note   Date:  01/29/2021   ID:  Eric Cordova, DOB 1950/06/15, MRN [id number]  PCP:  Seward Carol, MD    No chief complaint on file.  Atrial fibrillation  Wt Readings from Last 3 Encounters:  01/29/21 224 lb (101.6 kg)  03/30/20 217 lb 6.4 oz (98.6 kg)  08/06/19 (!) 220 lb (99.8 kg)       History of Present Illness: Eric Cordova is a 71 y.o. male  who has had atrial fibrillation.  I first saw him in 2021.   In May 2021, he went to the emergency room after feeling the acute onset of palpitations.  His watch was able to inform him of an irregular heart rhythm.  Initial ECG showed atrial fibrillation with rapid ventricular response.  He was cardioverted to normal sinus rhythm in the emergency room after few hours.  He was started on Eliquis, but ultimately had not taken this medicine.  He did start aspirin.  He has made a point to check his watch regularly to see if there is any additional atrial fibrillation.   His father had an MI at age 46.  Tore right bicep after a fall.  Has not been walking since.  Planning on getting back to regular exercise routine.  Denies : Chest pain. Dizziness. Leg edema. Nitroglycerin use. Orthopnea. Palpitations. Paroxysmal nocturnal dyspnea. Shortness of breath. Syncope.    Changed PCP to Dr. Lodema Hong 128/70 reading a few weeks ago.   Drinks some tea in the morning, but not much coffee.  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    w/ irritation   Pneumonia     Past Surgical History:  Procedure Laterality Date   COLON SURGERY     CYSTOSCOPY N/A 12/26/2014   Procedure: CYSTOSCOPY;  Surgeon: Alexis Frock, MD;  [facility];  Service: Urology;  Laterality: N/A;   HERNIA REPAIR  1960, 1974    inguinal bilateral   HYDROCELE EXCISION Right 1984   INGUINAL HERNIA REPAIR Bilateral 1960 & 1974   INSERTION OF MESH  11/07/2011   Procedure: INSERTION OF MESH;  Surgeon: Gwenyth Ober, MD;  Location: [facility];  Service: General;  Laterality: N/A;   LAPAROSCOPIC SIGMOID COLECTOMY  11-12-2010   diverticulitis   NASAL SEPTOPLASTY W/ TURBINOPLASTY Bilateral 09/11/2018   Procedure: NASAL SEPTOPLASTY WITH BILATERAL TURBINATE REDUCTION;  Surgeon: Leta Baptist, MD;  Location: [facility];  Service: ENT;  Laterality: Bilateral;   [place] N/A 12/26/2014   Procedure: PENILE  SKIN BIOPSY WITH PENILE BLOCK;  Surgeon: Alexis Frock, MD;  Location: [facility];  Service: Urology;  Laterality: N/A;   PILONIDAL CYST EXCISION  1973   TONSILLECTOMY  1958   VENTRAL HERNIA REPAIR  11/07/2011   Procedure: LAPAROSCOPIC VENTRAL HERNIA;  Surgeon: Gwenyth Ober, MD;  Location: [facility];  Service: General;  Laterality: N/A;     Current Outpatient Medications  Medication Sig Dispense Refill   aspirin EC 81 MG tablet Take 81 mg by mouth daily. Swallow whole.     diphenhydrAMINE HCl (ZZZQUIL) 50 MG/30ML LIQD Take 30 mLs by mouth at bedtime.     ezetimibe (ZETIA) 10  MG tablet Take 10 mg by mouth daily.     fenofibrate (TRICOR) 145 MG tablet Take 145 mg by mouth daily.     levothyroxine (SYNTHROID, LEVOTHROID) 50 MCG tablet Take 50-75 mcg by mouth daily before breakfast. 1.5 tablet on Monday, Wednesday and Friday; 1 tablet all other days of the week     rosuvastatin (CRESTOR) 20 MG tablet TAKE ONE TABLET BY MOUTH DAILY 90 tablet 0   sildenafil (VIAGRA) 25 MG tablet 1 tablet as needed     sodium chloride (MURO 128) 2 % ophthalmic solution Place 1 drop into both eyes every 4 (four) hours as needed for eye irritation.     Zinc Sulfate (ZINC 15 PO) Take 1 tablet by mouth 2 (two) times daily.     No current facility-administered medications for this visit.     Allergies:   Atorvastatin    Social History:  The patient  reports that he has never smoked. He has never used smokeless tobacco. He reports current alcohol use of about 7.0 - 14.0 standard drinks per week. He reports that he does not use drugs.   Family History:  The patient's family history includes Arthritis in his maternal grandmother; Asthma in his mother; Cancer in his father, maternal uncle, and mother; Dementia in his father; Heart disease in his father; Hypertension in his brother; Thyroid disease in his mother.    ROS:  Please see the history of present illness.   Otherwise, review of systems are positive for biceps injury.   All other systems are reviewed and negative.    PHYSICAL EXAM: VS:  BP 116/70    Pulse 61    Ht 5\' 7"  (1.702 m)    Wt 224 lb (101.6 kg)    SpO2 98%    BMI 35.08 kg/m  , BMI Body mass index is 35.08 kg/m. GEN: Well nourished, well developed, in no acute distress HEENT: normal Neck: no JVD, carotid bruits, or masses Cardiac: RRR; no murmurs, rubs, or gallops,no edema  Respiratory:  clear to auscultation bilaterally, normal work of breathing GI: soft, nontender, nondistended, + BS MS: no deformity or atrophy Skin: warm and dry, no rash Neuro:  Strength and sensation are intact Psych: euthymic mood, full affect   EKG:   The ekg ordered today demonstrates NSR, no ST changes   Recent Labs: No results found for requested labs within last 8760 hours.   Lipid Panel    Component Value Date/Time   CHOL 287 (H) 02/12/2015 0818   TRIG 120 02/12/2015 0818   HDL 67 02/12/2015 0818   CHOLHDL 4.3 02/12/2015 0818   VLDL 24 02/12/2015 0818   LDLCALC 196 (H) 02/12/2015 0818     Other studies Reviewed: Additional studies/ records that were reviewed today with results demonstrating: Labs reviewed, LDL 78 in 2022.  Liver, thyroid, kidney tests are all well controlled.   ASSESSMENT AND PLAN:  AFib: Occurred during high stress.  No AFib noted on [employer]  watch.  Continuing aspirin.   Family history of coronary artery disease: No change from prior.  Aortic atherosclerosis/hyperlipidemia: Continue rosuvastatin.  Increase fiber intake.  Whole food, plant-based diet recommended.  Avoid processed foods. Elevated BP: Controlled today.  Usually has whitecoat hypertension but last few visits, readings have been good.   Current medicines are reviewed at length with the patient today.  The patient concerns regarding his medicines were addressed.  The following changes have been made:  No change  Labs/ tests ordered today include:  No orders of the defined types were placed in this encounter.   Recommend 150 minutes/week of aerobic exercise Low fat, low carb, high fiber diet recommended  Disposition:   FU in 1 year   Signed, Larae Grooms, MD  01/29/2021 8:45 AM    [facility] [place], [place], [place]  [address] Phone: [phone]; Fax: [phone]

## 2021-01-29 ENCOUNTER — Ambulatory Visit: Payer: Medicare Other | Admitting: Interventional Cardiology

## 2021-01-29 ENCOUNTER — Other Ambulatory Visit: Payer: Self-pay

## 2021-01-29 ENCOUNTER — Encounter: Payer: Self-pay | Admitting: Interventional Cardiology

## 2021-01-29 VITALS — BP 116/70 | HR 61 | Ht 67.0 in | Wt 224.0 lb

## 2021-01-29 DIAGNOSIS — E785 Hyperlipidemia, unspecified: Secondary | ICD-10-CM | POA: Diagnosis not present

## 2021-01-29 DIAGNOSIS — Z8249 Family history of ischemic heart disease and other diseases of the circulatory system: Secondary | ICD-10-CM | POA: Diagnosis not present

## 2021-01-29 DIAGNOSIS — I4891 Unspecified atrial fibrillation: Secondary | ICD-10-CM

## 2021-01-29 DIAGNOSIS — I700 Atherosclerosis of aorta: Secondary | ICD-10-CM

## 2021-01-29 NOTE — Patient Instructions (Signed)

## 2021-04-02 ENCOUNTER — Other Ambulatory Visit: Payer: Self-pay | Admitting: Interventional Cardiology

## 2021-04-02 DIAGNOSIS — E785 Hyperlipidemia, unspecified: Secondary | ICD-10-CM

## 2021-09-07 ENCOUNTER — Ambulatory Visit: Payer: Medicare Other | Admitting: Podiatry

## 2021-09-17 ENCOUNTER — Ambulatory Visit: Payer: Medicare Other | Admitting: Podiatry

## 2021-09-17 DIAGNOSIS — L600 Ingrowing nail: Secondary | ICD-10-CM | POA: Diagnosis not present

## 2021-09-17 DIAGNOSIS — M79675 Pain in left toe(s): Secondary | ICD-10-CM

## 2021-09-17 NOTE — Progress Notes (Unsigned)
Subjective:   Patient ID: Eric Cordova, male   DOB: 71 y.o.   MRN: [id number]   HPI Chief Complaint  Patient presents with   Ingrown Toenail    Patient came in today for a left hallux ingrown toenail, started 2 years ago, rate of pain 1 out 10, patient did have some swelling and redness, TX: soaking, cut some of the nail    He has been dealing with this for years. His wife a few weeks soaked it in epsom salts and with peroxide and she clipped/filed the side of the nail.he has been using neosporin on it. Sine then the pain is 1/10 and seldom. Still cleaning with persxide. No drainage/pus.   Fear of needles and had a bad expereince on the right for ingrown removal    ROS      Objective:  Physical Exam  ***     Assessment:  ***     Plan:  ***    -going to discuss with wife, may do at sx center

## 2021-09-20 ENCOUNTER — Telehealth: Payer: Self-pay

## 2021-09-20 NOTE — Telephone Encounter (Signed)
Surgical center per the patient

## 2021-09-28 ENCOUNTER — Telehealth: Payer: Self-pay | Admitting: Urology

## 2021-09-28 NOTE — Telephone Encounter (Signed)
DOS - 10/06/21  EXC NAIL PERM 1ST LEFT --- 79150  [employer] EFFECTIVE DATE - 02/10/21  PLAN DEDUCTIBLE - $0.0 OUT OF POCKET - $3,600.00 W/  $3,540.00 REMAINING  COINSURANCE - 0% COPAY - $295.00    PER [employer] WEBSITE FOR CPT CODE 41364 Notification or Prior Authorization is not required for the requested services  Decision ID #:[id number]

## 2021-10-06 ENCOUNTER — Other Ambulatory Visit: Payer: Self-pay | Admitting: Podiatry

## 2021-10-06 ENCOUNTER — Encounter: Payer: Self-pay | Admitting: Podiatry

## 2021-10-06 DIAGNOSIS — L600 Ingrowing nail: Secondary | ICD-10-CM | POA: Diagnosis not present

## 2021-10-06 MED ORDER — TRAMADOL HCL 50 MG PO TABS: 50.0000 mg | ORAL_TABLET | Freq: Two times a day (BID) | ORAL | 0 refills | Status: AC | PRN

## 2021-10-06 MED ORDER — CEPHALEXIN 500 MG PO CAPS: 500.0000 mg | ORAL_CAPSULE | Freq: Three times a day (TID) | ORAL | 0 refills | Status: DC

## 2021-10-06 NOTE — Progress Notes (Signed)
Postop medications sent 

## 2021-10-18 ENCOUNTER — Ambulatory Visit: Payer: Medicare Other | Admitting: Podiatry

## 2021-10-18 DIAGNOSIS — L600 Ingrowing nail: Secondary | ICD-10-CM | POA: Insufficient documentation

## 2021-10-18 NOTE — Progress Notes (Signed)
Subjective: Chief Complaint  Patient presents with   Routine Post Op    Nail Check, Doing welling, TX: soaking, Keflex,   Patient denies and N/V/F/C/SOB    71 year old male presents the above concerns.  He states he is doing well but has stopped soaking but has been washing with soap and water.  Denies any drainage or pus.  He has no pain.  Objective: AAO x3, NAD DP/PT pulses palpable bilaterally, CRT less than 3 seconds Status post partial nail avulsion of the left hallux.  There is a scab overlying the area and some dried blood on the ingrown toenail site.  There is no pain.  No splinter redness or any drainage.  No signs of infection.  No pain. No pain with calf compression, swelling, warmth, erythema   Assessment: Post partial avulsion, healing well  Plan: -All treatment options discussed with the patient including all alternatives, risks, complications.  -Discussed to continue washing with soap and water, dry thoroughly and apply a bandage on a daily, the area for nighttime.  We can do this to the area heals.  We discussed measures to prevent reoccurrence.  Monitor for any signs or symptoms of infection -Patient encouraged to call the office with any questions, concerns, change in symptoms.   Trula Slade DPM

## 2021-10-19 ENCOUNTER — Ambulatory Visit: Payer: Medicare Other | Admitting: Podiatry

## 2022-07-21 ENCOUNTER — Encounter: Payer: Self-pay | Admitting: Podiatry

## 2022-07-21 ENCOUNTER — Ambulatory Visit: Payer: Medicare Other | Admitting: Podiatry

## 2022-07-21 DIAGNOSIS — M5430 Sciatica, unspecified side: Secondary | ICD-10-CM | POA: Insufficient documentation

## 2022-07-21 DIAGNOSIS — L600 Ingrowing nail: Secondary | ICD-10-CM

## 2022-07-21 DIAGNOSIS — L603 Nail dystrophy: Secondary | ICD-10-CM | POA: Diagnosis not present

## 2022-07-27 NOTE — Progress Notes (Signed)
Subjective: Chief Complaint  Patient presents with   Nail Problem    Hallux left - previous ingrown toenail, some of the nail fell off Hallux right - bruised area under nail, no injury, not sore   Debridement    Requesting toenail trim   72 year old male presents the office with above concerns.  Previously had ingrown toenail performed and for the nail to come off.  He did get a pedicure recently had acrylic placed to his toenails.  Also has noticed a bruised area underneath the right big toe.  No swelling redness or drainage.  No injuries that he reports.  As of the nails be trimmed today as he is having difficulty with them.   Objective: AAO x3, NAD DP/PT pulses palpable bilaterally, CRT less than 3 seconds Also noted hypertrophic, dystrophic with slight yellow, brown discoloration.  The right hallux toe there appears to be some dried blood present at the distal portion of the toenail without any extension of hyperpigmentation of the surrounding skin.  The nail is currently here.  No pain to the nail today.  No edema, erythema.  History of previous partial nail avulsion left hallux. No pain with calf compression, swelling, warmth, erythema  Assessment: Onychodystrophy  Plan: -All treatment options discussed with the patient including all alternatives, risks, complications.  -I right-sided did sharply debride the nail with any complications or bleeding symptoms for culture, pathology.  If symptoms persist we may proceed with further biopsy but this clinically.  To be dried blood but will monitor.  If no improvement consider nail removal.  As a courtesy debrided the nails with any complications or bleeding. -Patient encouraged to call the office with any questions, concerns, change in symptoms.   Vivi Barrack DPM

## 2022-07-28 ENCOUNTER — Telehealth: Payer: Self-pay | Admitting: Podiatry

## 2022-07-28 NOTE — Telephone Encounter (Signed)
Pt called and got the results on my chart but he is not sure if you want him to come in for an appt or can call him? Please advise

## 2022-07-29 NOTE — Telephone Encounter (Signed)
Notified pt results not back yet and we would call when they are in.

## 2022-08-04 ENCOUNTER — Encounter: Payer: Self-pay | Admitting: Podiatry

## 2022-10-12 ENCOUNTER — Ambulatory Visit: Payer: Medicare Other | Admitting: Interventional Cardiology

## 2022-10-19 IMAGING — CT CT CARDIAC CORONARY ARTERY CALCIUM SCORE
3 series · 14 of 20 positions shown, 15 images · non-contrast
Comparison: None.
COMPARISON: None.

Addendum:
EXAM:
OVER-READ INTERPRETATION  CT CHEST

The following report is an over-read performed by radiologist Dr.
Chai Tiger [REDACTED] on 04/27/2020. This
over-read does not include interpretation of cardiac or coronary
anatomy or pathology. The coronary calcium score interpretation by
the cardiologist is attached.
CLINICAL DATA: Risk stratification: 70 Year-old Caucasian male
Coronary Calcium Score
TECHNIQUE: The patient was scanned on a Siemens Force scanner. Axial
non-contrast 3 mm slices were carried out through the heart. The
data set was analyzed on a dedicated work station and scored using
the Agatson method.

[Series 2: casc 3.0 bv41 2 bestdiast 71 % · axial · 0.44mm/px · z∈[-218,-146]mm · 4 of 42 slices shown, 5 images]
[im 9/42  vessel]
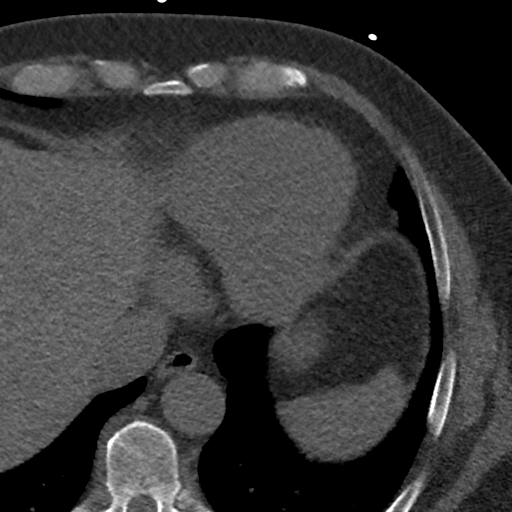
[im 9/42  lung]
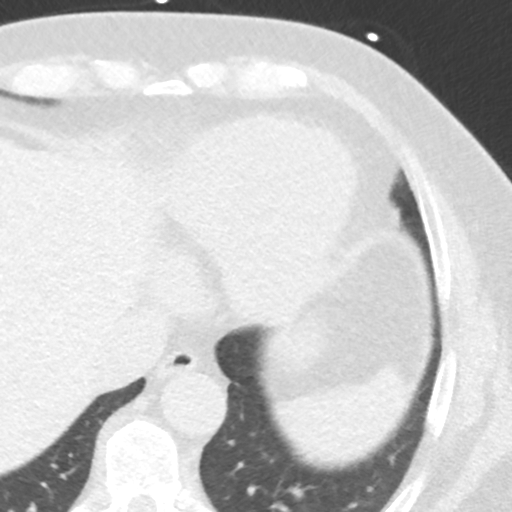
[im 17/42  vessel]
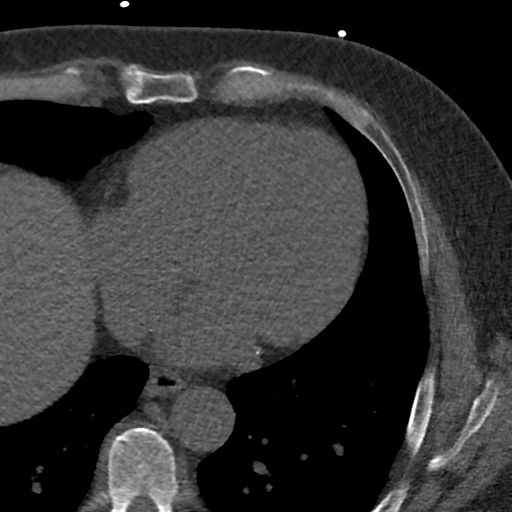
[im 25/42  vessel]
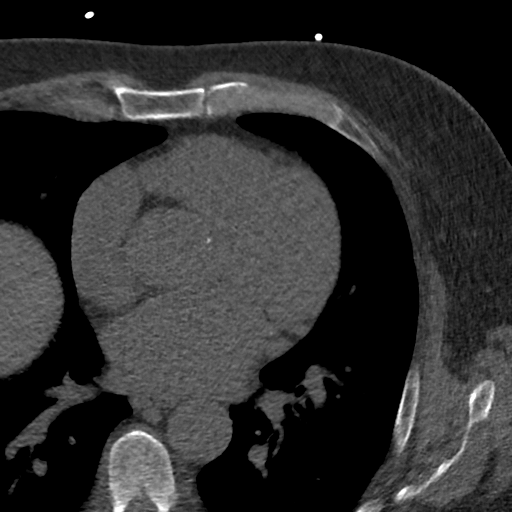
[im 33/42  vessel]
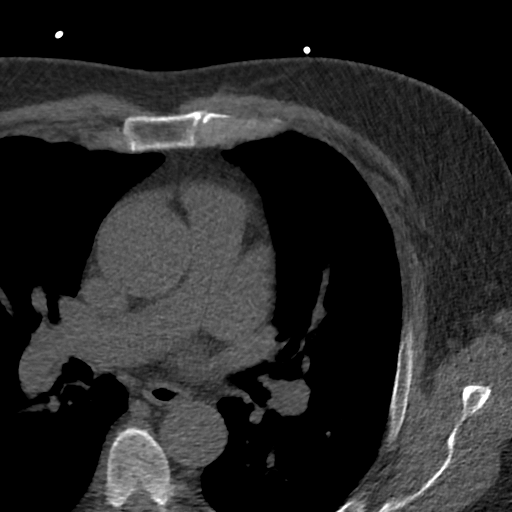

[Series 3: lung 71 % · axial · 0.68mm/px · z∈[-224,-140]mm · 5 of 42 slices shown]
[im 7/42  lung]
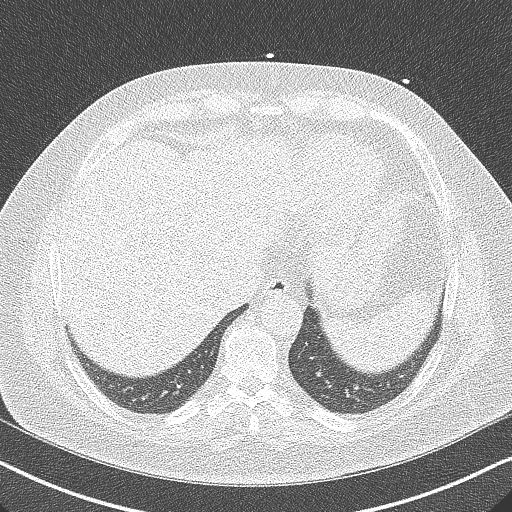
[im 14/42  lung]
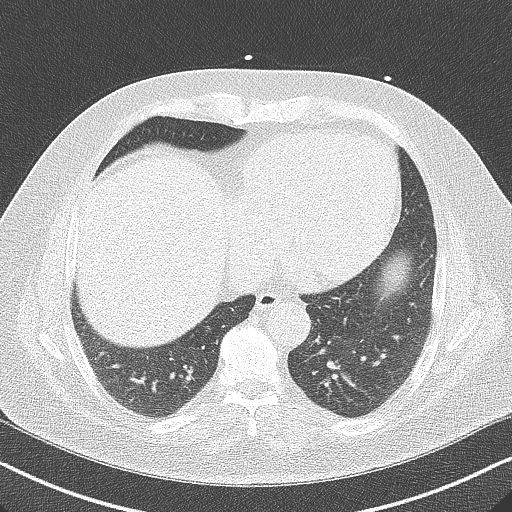
[im 21/42  lung]
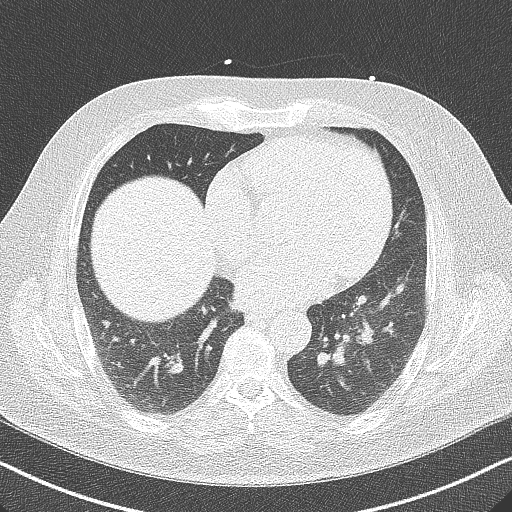
[im 28/42  lung]
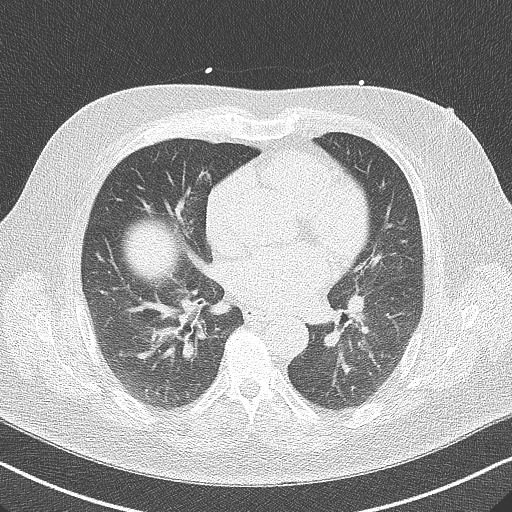
[im 35/42  lung]
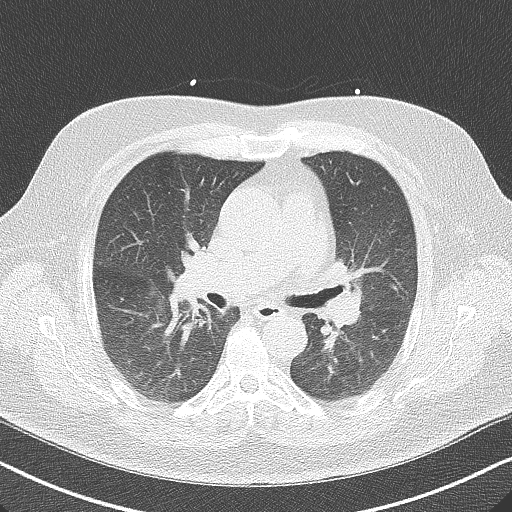

[Series 4: lung st 71 % · axial · 0.68mm/px · z∈[-224,-140]mm · 5 of 42 slices shown]
[im 7/42  lung]
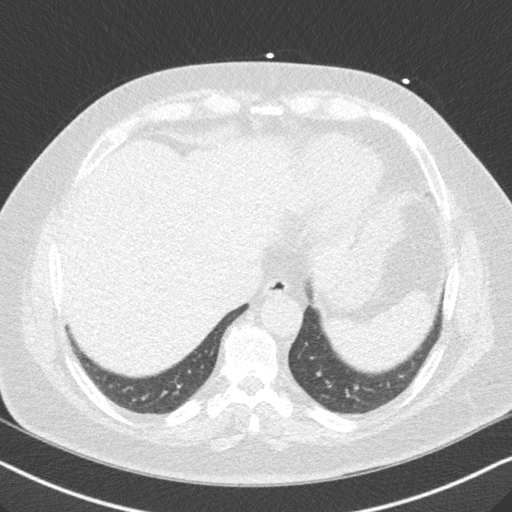
[im 14/42  lung]
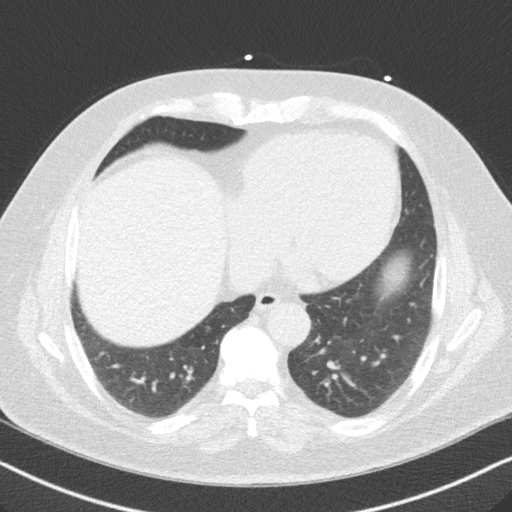
[im 21/42  lung]
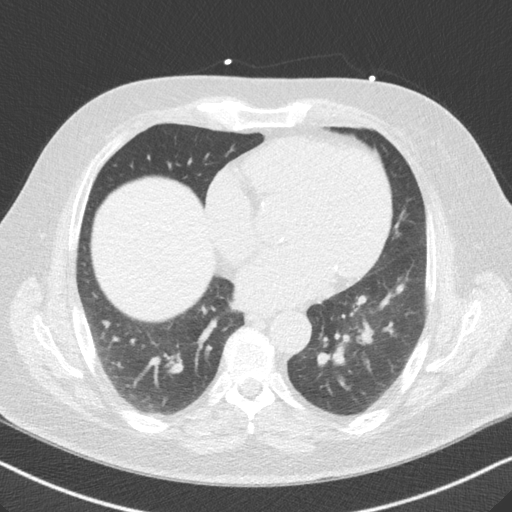
[im 28/42  lung]
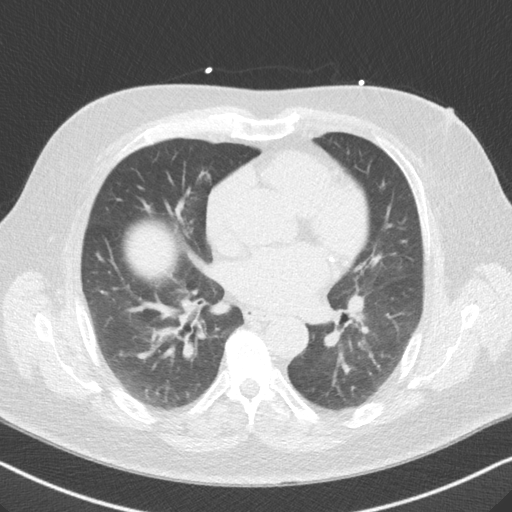
[im 35/42  lung]
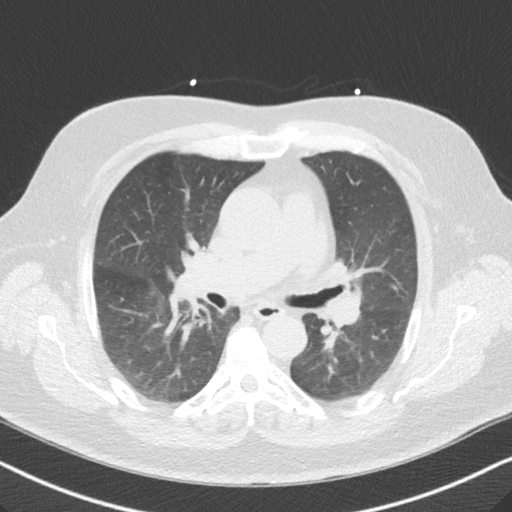

[14 of 20 positions shown; findings below may reference images not displayed]

FINDINGS: Atherosclerotic calcifications in the thoracic aorta. Within the
visualized portions of the thorax there are no suspicious appearing
pulmonary nodules or masses, there is no acute consolidative
airspace disease, no pleural effusions, no pneumothorax and no
lymphadenopathy. Visualized portions of the upper abdomen are
unremarkable. There are no aggressive appearing lytic or blastic
lesions noted in the visualized portions of the skeleton.
IMPRESSION: 1.  Aortic Atherosclerosis (HYFZE-3UC.C).
FINDINGS: Non-cardiac: See separate report from [REDACTED].

Ascending Aorta: Mild dilation on non-contrast study: 43 mm. Aortic
atherosclerosis noted.

Pericardium: Normal.

Coronary arteries: Normal origins.

Coronary Calcium Score:

Left main: 42

Left anterior descending artery: 88

Left circumflex artery: 260

Right coronary artery: 33

Total: 423

Percentile: 72nd for age, sex, and race matched control.
IMPRESSION: 1. Coronary calcium score of 423. This was 72nd percentile for age,
gender, and race matched controls.

2. Mild dilation of ascending aorta on non-contrast study: 43 mm.
Consider secondary imaging modality (echocardiogram/CT/MRA) if
clinically indicated.

3.  Aortic atherosclerosis noted.

RECOMMENDATIONS:

Coronary artery calcium (CAC) score is a strong predictor of
incident coronary heart disease (CHD) and provides predictive
information beyond traditional risk factors. CAC scoring is
reasonable to use in the decision to withhold, postpone, or initiate
statin therapy in intermediate-risk or selected borderline-risk
asymptomatic adults (age 40-75 years and LDL-C ?70 to <190 mg/dL)
who do not have diabetes or established atherosclerotic
cardiovascular disease (ASCVD).* In intermediate-risk (10-year ASCVD
risk ?7.5% to <20%) adults or selected borderline-risk (10-year
ASCVD risk ?5% to <7.5%) adults in whom a CAC score is measured for
the purpose of making a treatment decision the following
recommendations have been made:

If CAC = 0, it is reasonable to withhold statin therapy and reassess
in 5 to 10 years, as long as higher risk conditions are absent
(diabetes mellitus, family history of premature CHD in first degree
relatives (males <55 years; females <65 years), cigarette smoking,
LDL ?190 mg/dL or other independent risk factors).

If CAC is 1 to 99, it is reasonable to initiate statin therapy for
patients ?55 years of age.

If CAC is ?100 or ?75th percentile, it is reasonable to initiate
statin therapy at any age.

Cardiology referral should be considered for patients with CAC
scores ?400 or ?75th percentile.

*1774 AHA/ACC/AACVPR/AAPA/ABC/KRATHOS/BANO/BAGUAL/Lorenzo Von [place]/TOGTAYEV/PEDERSEN/ART
Guideline on the Management of Blood Cholesterol: A Report of the
American College of Cardiology/American Heart Association Task Force
on Clinical Practice Guidelines. J Am Coll Cardiol.
2662;73(24):7518-7534.

*** End of Addendum ***
EXAM:
OVER-READ INTERPRETATION  CT CHEST

The following report is an over-read performed by radiologist Dr.
Chai Tiger [REDACTED] on 04/27/2020. This
over-read does not include interpretation of cardiac or coronary
anatomy or pathology. The coronary calcium score interpretation by
the cardiologist is attached.
FINDINGS: Atherosclerotic calcifications in the thoracic aorta. Within the
visualized portions of the thorax there are no suspicious appearing
pulmonary nodules or masses, there is no acute consolidative
airspace disease, no pleural effusions, no pneumothorax and no
lymphadenopathy. Visualized portions of the upper abdomen are
unremarkable. There are no aggressive appearing lytic or blastic
lesions noted in the visualized portions of the skeleton.
IMPRESSION: 1.  Aortic Atherosclerosis (HYFZE-3UC.C).

## 2022-11-20 NOTE — Progress Notes (Unsigned)
Cardiology Office Note:   Date:  11/23/2022  ID:  Eric Cordova, DOB Jun 08, 1950, MRN [id number] PCP: Alysia Penna, MD  [facility] Providers Cardiologist:  Lance Muss, MD {  History of Present Illness:   Eric Cordova is a 72 y.o. male who has had atrial fibrillation.   He was previously seen by Dr. Eldridge Dace.    He said he had this 1 episode of fibrillation years ago when he was under significant stress.  He has since not had that problem.  He wears his Apple Watch which alerted him to his first event and he has never had another episode.  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  The patient denies any new symptoms such as chest discomfort, neck or arm discomfort. There has been no new shortness of breath, PND or orthopnea. There have been no reported palpitations, presyncope or syncope.    ROS:  As stated in the HPI and negative for all other systems.  Studies Reviewed:    EKG:   EKG Interpretation Date/Time:  Wednesday November 23 2022 10:02:20 EST Ventricular Rate:  52 PR Interval:  160 QRS Duration:  82 QT Interval:  448 QTC Calculation: 416 R Axis:   62  Text Interpretation: Sinus bradycardia Septal infarct , age undetermined ST & T wave abnormality, consider inferolateral ischemia When compared with ECG of 26-May-2019 02:54,  T wave changes are new since previous. Confirmed by Rollene Rotunda ([id number]) on 11/23/2022 10:13:11 AM    Risk Assessment/Calculations:    CHA2DS2-VASc Score = 1   This indicates a 0.6% annual risk of stroke. The patient's score is based upon: CHF History: 0 HTN History: 0 Diabetes History: 0 Stroke History: 0 Vascular Disease History: 0 Age Score: 1 Gender Score: 0            Physical Exam:   VS:  BP 128/88 (BP Location: Left Arm, Patient Position: Sitting, Cuff Size: Large)   Pulse (!) 52   Ht 5\' 6"  (1.676 m)   Wt 224 lb 6.4 oz (101.8  kg)   SpO2 98%   BMI 36.22 kg/m    Wt Readings from Last 3 Encounters:  11/23/22 224 lb 6.4 oz (101.8 kg)  01/29/21 224 lb (101.6 kg)  03/30/20 217 lb 6.4 oz (98.6 kg)     GEN: Well nourished, well developed in no acute distress NECK: No JVD; No carotid bruits CARDIAC: RRR, no murmurs, rubs, gallops RESPIRATORY:  Clear to auscultation without rales, wheezing or rhonchi  ABDOMEN: Soft, non-tender, non-distended EXTREMITIES:  No edema; No deformity   ASSESSMENT AND PLAN:   AFib: Occurred during high stress.  He has had no recurrence of this.  He has a low CHA2DS2-VASc score.  He is monitoring this on his Watch.  No change in therapy.   Aortic atherosclerosis: He is having aggressive primary risk reduction by his previous cardiologist and I agree with this.  We reviewed this.  Hyperlipidemia: LDL was 102 with an HDL of 67.  No change in therapy.  We talked about a plant forward diet.  Elevated BP: There is been a mention of this but only whitecoat hypertension.  No change in therapy.  AI: I will follow-up an echocardiogram as below.  Abnormal EKG: He has new T wave inversion but no symptoms consistent with ischemia.  I am going to check an echocardiogram to follow-up for any wall  motion abnormalities or evidence of left ventricular hypertrophy.     Follow up with me in one year.   Signed, Rollene Rotunda, MD

## 2022-11-23 ENCOUNTER — Ambulatory Visit: Payer: Medicare Other | Attending: Interventional Cardiology | Admitting: Cardiology

## 2022-11-23 ENCOUNTER — Encounter: Payer: Self-pay | Admitting: Cardiology

## 2022-11-23 VITALS — BP 128/88 | HR 52 | Ht 66.0 in | Wt 224.4 lb

## 2022-11-23 DIAGNOSIS — I700 Atherosclerosis of aorta: Secondary | ICD-10-CM | POA: Diagnosis not present

## 2022-11-23 DIAGNOSIS — I10 Essential (primary) hypertension: Secondary | ICD-10-CM

## 2022-11-23 DIAGNOSIS — I480 Paroxysmal atrial fibrillation: Secondary | ICD-10-CM

## 2022-11-23 DIAGNOSIS — E785 Hyperlipidemia, unspecified: Secondary | ICD-10-CM | POA: Diagnosis not present

## 2022-11-23 DIAGNOSIS — R9431 Abnormal electrocardiogram [ECG] [EKG]: Secondary | ICD-10-CM

## 2022-11-23 DIAGNOSIS — Z79899 Other long term (current) drug therapy: Secondary | ICD-10-CM

## 2022-11-23 NOTE — Patient Instructions (Signed)
Medication Instructions:  No changes *If you need a refill on your cardiac medications before your next appointment, please call your pharmacy*   Lab Work: Magnesium- today If you have labs (blood work) drawn today and your tests are completely normal, you will receive your results only by: MyChart Message (if you have MyChart) OR A paper copy in the mail If you have any lab test that is abnormal or we need to change your treatment, we will call you to review the results.   Testing/Procedures: Your physician has requested that you have an echocardiogram. Echocardiography is a painless test that uses sound waves to create images of your heart. It provides your doctor with information about the size and shape of your heart and how well your heart's chambers and valves are working. This procedure takes approximately one hour. There are no restrictions for this procedure. Please do NOT wear cologne, perfume, aftershave, or lotions (deodorant is allowed). Please arrive 15 minutes prior to your appointment time.  Please note: We ask at that you not bring children with you during ultrasound (echo/ vascular) testing. Due to room size and safety concerns, children are not allowed in the ultrasound rooms during exams. Our front office staff cannot provide observation of children in our lobby area while testing is being conducted. An adult accompanying a patient to their appointment will only be allowed in the ultrasound room at the discretion of the ultrasound technician under special circumstances. We apologize for any inconvenience.    Follow-Up: At [facility], you and your health needs are our priority.  As part of our continuing mission to provide you with exceptional heart care, we have created designated Provider Care Teams.  These Care Teams include your primary Cardiologist (physician) and Advanced Practice Providers (APPs -  Physician Assistants and Nurse Practitioners) who all work  together to provide you with the care you need, when you need it.  We recommend signing up for the patient portal called "MyChart".  Sign up information is provided on this After Visit Summary.  MyChart is used to connect with patients for Virtual Visits (Telemedicine).  Patients are able to view lab/test results, encounter notes, upcoming appointments, etc.  Non-urgent messages can be sent to your provider as well.   To learn more about what you can do with MyChart, go to [email or web address].    Your next appointment:   1 year(s)  Provider:   Dr Antoine Poche

## 2022-11-24 LAB — MAGNESIUM: Magnesium: 2.2 mg/dL (ref 1.6–2.3)

## 2022-12-10 ENCOUNTER — Emergency Department (HOSPITAL_COMMUNITY)
Admission: EM | Admit: 2022-12-10 | Discharge: 2022-12-10 | Disposition: A | Discharge status: Home / Self Care | Payer: Medicare Other | Attending: Emergency Medicine | Admitting: Emergency Medicine

## 2022-12-10 ENCOUNTER — Other Ambulatory Visit: Payer: Self-pay

## 2022-12-10 ENCOUNTER — Encounter (HOSPITAL_COMMUNITY): Payer: Self-pay

## 2022-12-10 ENCOUNTER — Emergency Department (HOSPITAL_COMMUNITY): Payer: Medicare Other

## 2022-12-10 DIAGNOSIS — K5732 Diverticulitis of large intestine without perforation or abscess without bleeding: Secondary | ICD-10-CM | POA: Diagnosis not present

## 2022-12-10 DIAGNOSIS — Z79890 Hormone replacement therapy (postmenopausal): Secondary | ICD-10-CM | POA: Insufficient documentation

## 2022-12-10 DIAGNOSIS — I10 Essential (primary) hypertension: Secondary | ICD-10-CM | POA: Diagnosis not present

## 2022-12-10 DIAGNOSIS — E039 Hypothyroidism, unspecified: Secondary | ICD-10-CM | POA: Insufficient documentation

## 2022-12-10 DIAGNOSIS — Z7982 Long term (current) use of aspirin: Secondary | ICD-10-CM | POA: Diagnosis not present

## 2022-12-10 DIAGNOSIS — R1030 Lower abdominal pain, unspecified: Secondary | ICD-10-CM | POA: Diagnosis present

## 2022-12-10 DIAGNOSIS — Z79899 Other long term (current) drug therapy: Secondary | ICD-10-CM | POA: Insufficient documentation

## 2022-12-10 LAB — URINALYSIS, ROUTINE W REFLEX MICROSCOPIC
Bacteria, UA: NONE SEEN
Bilirubin Urine: NEGATIVE
Glucose, UA: NEGATIVE mg/dL
Ketones, ur: NEGATIVE mg/dL
Leukocytes,Ua: NEGATIVE
Nitrite: NEGATIVE
Protein, ur: NEGATIVE mg/dL
Specific Gravity, Urine: 1.008 (ref 1.005–1.030)
pH: 7 (ref 5.0–8.0)

## 2022-12-10 LAB — CBC WITH DIFFERENTIAL/PLATELET
Abs Immature Granulocytes: 0.01 10*3/uL (ref 0.00–0.07)
Basophils Absolute: 0 10*3/uL (ref 0.0–0.1)
Basophils Relative: 1 %
Eosinophils Absolute: 0.3 10*3/uL (ref 0.0–0.5)
Eosinophils Relative: 5 %
HCT: 39.1 % (ref 39.0–52.0)
Hemoglobin: 13.1 g/dL (ref 13.0–17.0)
Immature Granulocytes: 0 %
Lymphocytes Relative: 23 %
Lymphs Abs: 1.5 10*3/uL (ref 0.7–4.0)
MCH: 28.7 pg (ref 26.0–34.0)
MCHC: 33.5 g/dL (ref 30.0–36.0)
MCV: 85.6 fL (ref 80.0–100.0)
Monocytes Absolute: 0.9 10*3/uL (ref 0.1–1.0)
Monocytes Relative: 14 %
Neutro Abs: 3.8 10*3/uL (ref 1.7–7.7)
Neutrophils Relative %: 57 %
Platelets: 190 10*3/uL (ref 150–400)
RBC: 4.57 MIL/uL (ref 4.22–5.81)
RDW: 14.6 % (ref 11.5–15.5)
WBC: 6.5 10*3/uL (ref 4.0–10.5)
nRBC: 0 % (ref 0.0–0.2)

## 2022-12-10 LAB — COMPREHENSIVE METABOLIC PANEL
ALT: 15 U/L (ref 0–44)
AST: 18 U/L (ref 15–41)
Albumin: 3.6 g/dL (ref 3.5–5.0)
Alkaline Phosphatase: 33 U/L — ABNORMAL LOW (ref 38–126)
Anion gap: 10 (ref 5–15)
BUN: 16 mg/dL (ref 8–23)
CO2: 23 mmol/L (ref 22–32)
Calcium: 9.3 mg/dL (ref 8.9–10.3)
Chloride: 106 mmol/L (ref 98–111)
Creatinine, Ser: 1.06 mg/dL (ref 0.61–1.24)
GFR, Estimated: >60 mL/min (ref >60)
Glucose, Bld: 116 mg/dL — ABNORMAL HIGH (ref 70–99)
Potassium: 4 mmol/L (ref 3.5–5.1)
Sodium: 139 mmol/L (ref 135–145)
Total Bilirubin: 0.3 mg/dL (ref <1.2)
Total Protein: 6.8 g/dL (ref 6.5–8.1)

## 2022-12-10 LAB — LIPASE, BLOOD: Lipase: 33 U/L (ref 11–51)

## 2022-12-10 MED ORDER — IOHEXOL 300 MG/ML  SOLN
100.0000 mL | Freq: Once | INTRAMUSCULAR | Status: AC | PRN
  Administered 2022-12-10: 100 mL via INTRAVENOUS

## 2022-12-10 MED ORDER — METRONIDAZOLE 500 MG PO TABS
500.0000 mg | ORAL_TABLET | Freq: Once | ORAL | Status: AC
  Administered 2022-12-10: 500 mg via ORAL
  Filled 2022-12-10: qty 1

## 2022-12-10 MED ORDER — METRONIDAZOLE 500 MG PO TABS
500.0000 mg | ORAL_TABLET | Freq: Two times a day (BID) | ORAL | 0 refills | Status: DC
Start: 2022-12-10 — End: 2022-12-17

## 2022-12-10 MED ORDER — MORPHINE SULFATE (PF) 4 MG/ML IV SOLN
4.0000 mg | Freq: Once | INTRAVENOUS | Status: AC
  Administered 2022-12-10: 4 mg via INTRAVENOUS
  Filled 2022-12-10: qty 1

## 2022-12-10 MED ORDER — CIPROFLOXACIN HCL 500 MG PO TABS
500.0000 mg | ORAL_TABLET | Freq: Once | ORAL | Status: AC
  Administered 2022-12-10: 500 mg via ORAL
  Filled 2022-12-10: qty 1

## 2022-12-10 MED ORDER — CIPROFLOXACIN HCL 500 MG PO TABS: 500.0000 mg | ORAL_TABLET | Freq: Two times a day (BID) | ORAL | 0 refills | Status: DC

## 2022-12-10 MED ORDER — HYDROCODONE-ACETAMINOPHEN 5-325 MG PO TABS: 1.0000 | ORAL_TABLET | Freq: Four times a day (QID) | ORAL | 0 refills | Status: DC | PRN

## 2022-12-10 NOTE — ED Provider Notes (Signed)
[place] EMERGENCY DEPARTMENT AT [facility] Provider Note   CSN: [id number] Arrival date & time: 12/10/22  1112     History  Chief Complaint  Patient presents with   Diarrhea   Abdominal Pain    Eric Cordova is a 72 y.o. male.  Patient with history of HLD, diverticulitis s/p bowel resection 2012, hypothyroidism presents with progressively worsening abdominal pain across the lower abdomen and frequent, non-bloody diarrhea x 2 days. No fever, nausea or vomiting. No urinary symptoms. Reports eating at a restaurant for [employer] but his wife had the same menu item and is asymptomatic.   The history is provided by the patient and the spouse. No language interpreter was used.  Diarrhea Associated symptoms: abdominal pain   Abdominal Pain Associated symptoms: diarrhea        Home Medications Prior to Admission medications   Medication Sig Start Date End Date Taking? Authorizing Provider  Acetaminophen (TYLENOL) 325 MG CAPS Take 2 capsules by mouth daily as needed.   Yes [provider]  aspirin EC 81 MG tablet Take 81 mg by mouth at bedtime. Swallow whole.   Yes [provider]  bismuth subsalicylate (PEPTO BISMOL) 262 MG/15ML suspension Take 30 mLs by mouth every 6 (six) hours as needed for indigestion or diarrhea or loose stools.   Yes [provider]  ciprofloxacin (CIPRO) 500 MG tablet Take 1 tablet (500 mg total) by mouth every 12 (twelve) hours. 12/10/22  Yes Elpidio Anis, PA-C  ezetimibe (ZETIA) 10 MG tablet Take 10 mg by mouth daily.   Yes [provider]  fenofibrate (TRICOR) 145 MG tablet Take 145 mg by mouth daily.   Yes [provider]  HYDROcodone-acetaminophen (NORCO) 5-325 MG tablet Take 1 tablet by mouth every 6 (six) hours as needed for moderate pain (pain score 4-6). 12/10/22  Yes Geffrey Michaelsen, PA-C  L-Theanine 200 MG CAPS Take 200 mg by mouth as needed.   Yes [provider]   levothyroxine (SYNTHROID, LEVOTHROID) 50 MCG tablet Take 50-75 mcg by mouth daily before breakfast. 1.5 tablet on Monday, Wednesday and Friday; 1 tablet all other days of the week   Yes [provider]  magnesium gluconate (MAGONATE) 500 MG tablet Take 1,000 mg by mouth at bedtime.   Yes [provider]  Melatonin 10 MG CAPS Take 10 mg by mouth daily as needed (sleep).   Yes [provider]  metroNIDAZOLE (FLAGYL) 500 MG tablet Take 1 tablet (500 mg total) by mouth 2 (two) times daily. 12/10/22  Yes Leandre Wien, Melvenia Beam, PA-C  Multiple Vitamins-Minerals (PRESERVISION AREDS 2 PO) Take 1 each by mouth 2 (two) times daily.   Yes [provider]  rosuvastatin (CRESTOR) 20 MG tablet TAKE ONE TABLET BY MOUTH DAILY Patient taking differently: Take 20 mg by mouth daily. 04/02/21  Yes Corky Crafts, MD  simethicone (MYLICON) 125 MG chewable tablet Chew 125 mg by mouth every 6 (six) hours as needed for flatulence.   Yes [provider]  sodium chloride (MURO 128) 2 % ophthalmic solution Place 1 drop into both eyes every 4 (four) hours as needed for eye irritation.   Yes [provider]  Zinc Sulfate (ZINC 15 PO) Take 1 tablet by mouth 2 (two) times daily.   Yes [provider]      Allergies    Atorvastatin    Review of Systems   Review of Systems  Gastrointestinal:  Positive for abdominal pain and diarrhea.  Physical Exam Updated Vital Signs BP (!) 143/98 (BP Location: Right Arm)   Pulse (!) 52   Temp 97.7 F (36.5 C) (Oral)   Resp 14   Ht 5\' 6"  (1.676 m)   Wt 101.8 kg   SpO2 100%   BMI 36.22 kg/m  Physical Exam Vitals and nursing note reviewed.  Constitutional:      General: He is not in acute distress.    Appearance: He is well-developed.  Cardiovascular:     Rate and Rhythm: Normal rate and regular rhythm.     Heart sounds: No murmur heard. Pulmonary:     Effort: Pulmonary effort is normal.     Breath sounds: No  wheezing, rhonchi or rales.  Abdominal:     General: There is no distension.     Palpations: Abdomen is soft.     Tenderness: There is abdominal tenderness in the suprapubic area and left lower quadrant. There is no left CVA tenderness.  Skin:    General: Skin is warm and dry.  Neurological:     Mental Status: He is alert and oriented to person, place, and time.     ED Results / Procedures / Treatments   Labs (all labs ordered are listed, but only abnormal results are displayed) Labs Reviewed  COMPREHENSIVE METABOLIC PANEL - Abnormal; Notable for the following components:      Result Value   Glucose, Bld 116 (*)    Alkaline Phosphatase 33 (*)    All other components within normal limits  URINALYSIS, ROUTINE W REFLEX MICROSCOPIC - Abnormal; Notable for the following components:   Color, Urine STRAW (*)    Hgb urine dipstick SMALL (*)    All other components within normal limits  CBC WITH DIFFERENTIAL/PLATELET  LIPASE, BLOOD   Results for orders placed or performed during the hospital encounter of 12/10/22  CBC with Differential  Result Value Ref Range   WBC 6.5 4.0 - 10.5 K/uL   RBC 4.57 4.22 - 5.81 MIL/uL   Hemoglobin 13.1 13.0 - 17.0 g/dL   HCT 16.1 09.6 - 04.5 %   MCV 85.6 80.0 - 100.0 fL   MCH 28.7 26.0 - 34.0 pg   MCHC 33.5 30.0 - 36.0 g/dL   RDW 40.9 81.1 - 91.4 %   Platelets 190 150 - 400 K/uL   nRBC 0.0 0.0 - 0.2 %   Neutrophils Relative % 57 %   Neutro Abs 3.8 1.7 - 7.7 K/uL   Lymphocytes Relative 23 %   Lymphs Abs 1.5 0.7 - 4.0 K/uL   Monocytes Relative 14 %   Monocytes Absolute 0.9 0.1 - 1.0 K/uL   Eosinophils Relative 5 %   Eosinophils Absolute 0.3 0.0 - 0.5 K/uL   Basophils Relative 1 %   Basophils Absolute 0.0 0.0 - 0.1 K/uL   Immature Granulocytes 0 %   Abs Immature Granulocytes 0.01 0.00 - 0.07 K/uL  Comprehensive metabolic panel  Result Value Ref Range   Sodium 139 135 - 145 mmol/L   Potassium 4.0 3.5 - 5.1 mmol/L   Chloride 106 98 - 111 mmol/L    CO2 23 22 - 32 mmol/L   Glucose, Bld 116 (H) 70 - 99 mg/dL   BUN 16 8 - 23 mg/dL   Creatinine, Ser 7.82 0.61 - 1.24 mg/dL   Calcium 9.3 8.9 - 95.6 mg/dL   Total Protein 6.8 6.5 - 8.1 g/dL   Albumin 3.6 3.5 - 5.0 g/dL   AST 18 15 - 41  U/L   ALT 15 0 - 44 U/L   Alkaline Phosphatase 33 (L) 38 - 126 U/L   Total Bilirubin 0.3 <1.2 mg/dL   GFR, Estimated >56 >43 mL/min   Anion gap 10 5 - 15  Lipase, blood  Result Value Ref Range   Lipase 33 11 - 51 U/L  Urinalysis, Routine w reflex microscopic -Urine, Clean Catch  Result Value Ref Range   Color, Urine STRAW (A) YELLOW   APPearance CLEAR CLEAR   Specific Gravity, Urine 1.008 1.005 - 1.030   pH 7.0 5.0 - 8.0   Glucose, UA NEGATIVE NEGATIVE mg/dL   Hgb urine dipstick SMALL (A) NEGATIVE   Bilirubin Urine NEGATIVE NEGATIVE   Ketones, ur NEGATIVE NEGATIVE mg/dL   Protein, ur NEGATIVE NEGATIVE mg/dL   Nitrite NEGATIVE NEGATIVE   Leukocytes,Ua NEGATIVE NEGATIVE   RBC / HPF 0-5 0 - 5 RBC/hpf   WBC, UA 0-5 0 - 5 WBC/hpf   Bacteria, UA NONE SEEN NONE SEEN   Squamous Epithelial / HPF 0-5 0 - 5 /HPF    EKG None  Radiology CT ABDOMEN PELVIS W CONTRAST  Result Date: 12/10/2022 CLINICAL DATA:  Two day history of abdominal pain, diarrhea, and indigestion EXAM: CT ABDOMEN AND PELVIS WITH CONTRAST TECHNIQUE: Multidetector CT imaging of the abdomen and pelvis was performed using the standard protocol following bolus administration of intravenous contrast. RADIATION DOSE REDUCTION: This exam was performed according to the departmental dose-optimization program which includes automated exposure control, adjustment of the mA and/or kV according to patient size and/or use of iterative reconstruction technique. CONTRAST:  OMNIPAQUE IOHEXOL 300 MG/ML  SOLN COMPARISON:  CT abdomen and pelvis dated 09/12/2019 FINDINGS: Lower chest: No focal consolidation or pulmonary nodule in the lung bases. No pleural effusion or pneumothorax demonstrated.  Partially imaged heart size is normal. Coronary artery calcifications. Hepatobiliary: Scattered subcentimeter hypodensities, too small to characterize. No intra or extrahepatic biliary ductal dilation. Normal gallbladder. Pancreas: No focal lesions or main ductal dilation. Spleen: Normal in size without focal abnormality. Adrenals/Urinary Tract: No adrenal nodules. No suspicious renal mass, calculi or hydronephrosis. No focal bladder wall thickening. Stomach/Bowel: Normal appearance of the stomach. Postsurgical changes of the sigmoid colon with patent anastomosis. There is acute diverticulitis with pericolonic stranding immediately upstream of the anastomosis. Normal appendix. Vascular/Lymphatic: Aortic atherosclerosis. No enlarged abdominal or pelvic lymph nodes. Reproductive: Enlargement of the prostate with median lobe hypertrophy. Other: No free fluid, fluid collection, or free air. Musculoskeletal: No acute or abnormal lytic or blastic osseous lesions. IMPRESSION: 1. Acute uncomplicated diverticulitis of the sigmoid colon immediately upstream of the anastomosis. 2. Enlargement of the prostate with median lobe hypertrophy. 3. Aortic Atherosclerosis (ICD10-I70.0). Coronary artery calcifications. Assessment for potential risk factor modification, dietary therapy or pharmacologic therapy may be warranted, if clinically indicated. Electronically Signed   By: Agustin Cree M.D.   On: 12/10/2022 14:09    Procedures Procedures    Medications Ordered in ED Medications  morphine (PF) 4 MG/ML injection 4 mg (4 mg Intravenous Given 12/10/22 1208)  iohexol (OMNIPAQUE) 300 MG/ML solution 100 mL (100 mLs Intravenous Contrast Given 12/10/22 1348)  ciprofloxacin (CIPRO) tablet 500 mg (500 mg Oral Given 12/10/22 1533)  metroNIDAZOLE (FLAGYL) tablet 500 mg (500 mg Oral Given 12/10/22 1533)    ED Course/ Medical Decision Making/ A&P Clinical Course as of 12/11/22 1516  Sat Dec 10, 2022  1320 Recheck: pain medication  provided good relief of pain. Waiting on CT and Urine collection. No needs  at this time.  [SU]    Clinical Course User Index [SU] Elpidio Anis, PA-C                                 Medical Decision Making This patient presents to the ED for concern of abdominal pain, this involves an extensive number of treatment options, and is a complaint that carries with it a high risk of complications and morbidity.  The differential diagnosis includes diverticulitis, colitis, mass, perforation, constipation, viral GE   Co morbidities that complicate the patient evaluation  H/o diverticulitis requiring colectomy; HLD, HTN, hypothyroidism   Additional history obtained:  Additional history and/or information obtained from chart review, notable for Wife and previous medical charting   Lab Tests:  I Ordered, and personally interpreted labs.  The pertinent results include:  UA negative; no leukocytosis, normal electrolytes and renal function    Imaging Studies ordered:  I ordered imaging studies including ct abd/pel I independently visualized and interpreted imaging which showed c/w uncomplicated sigmoid diverticulitis I agree with the radiologist interpretation     Medicines ordered and prescription drug management:  I ordered medication including morphine  for pain Reevaluation of the patient after these medicines showed that the patient improved I have reviewed the patients home medicines and have made adjustments as needed    Critical Interventions:  Patient stable during ED encounter without need for critical intervention    Problem List / ED Course:  H/o diverticulitis requiring partial colectomy (2012) Here with abdominal pain, nonbloody diarrhea, no vomiting, no fever Abdomen nondistended, soft  CT evidence uncomplicated diverticulitis Started oral antibiotics, will provide pain control   Reevaluation:  After the interventions noted above, I reevaluated the  patient and found that they have :improved   Social Determinants of Health:  Good family support and established medical care   Disposition:  After consideration of the diagnostic results and the patients response to treatment, I feel that the patient would benefit from discharge home. Discussed strict return precautions that should prompt return to the ED.   Amount and/or Complexity of Data Reviewed Labs: ordered. Radiology: ordered.  Risk Prescription drug management.           Final Clinical Impression(s) / ED Diagnoses Final diagnoses:  Sigmoid diverticulitis    Rx / DC Orders ED Discharge Orders          Ordered    metroNIDAZOLE (FLAGYL) 500 MG tablet  2 times daily        12/10/22 1526    ciprofloxacin (CIPRO) 500 MG tablet  Every 12 hours        12/10/22 1526    HYDROcodone-acetaminophen (NORCO) 5-325 MG tablet  Every 6 hours PRN        12/10/22 1526              Elpidio Anis, PA-C 12/11/22 1516    Franne Forts, DO 12/16/22 2354

## 2022-12-10 NOTE — ED Notes (Signed)
Patient has a urine culture in the main lab 

## 2022-12-10 NOTE — ED Triage Notes (Addendum)
Patient reports abdominal pain, diarrhea, indigestion x 2 days. Denies fevers, nausea and vomiting.  Has hx of diverticulitis, had a colon resection to prevent it. Just recently started eating whole grain bagels with seeds. Has not had anything with seeds in the last 12 years, was told to avoid them.

## 2022-12-10 NOTE — Discharge Instructions (Signed)
Take the antibiotics as prescribed for the next 7 days. Take norco if needed for pain.   Return to the emergency department immediately if you develop increased or significant pain, bloody stools, any fever or vomiting.   Follow up with your GI doctor as planned.

## 2022-12-16 ENCOUNTER — Inpatient Hospital Stay (HOSPITAL_COMMUNITY)
Admission: EM | Admit: 2022-12-16 | Discharge: 2022-12-18 | DRG: 379 | Disposition: A | Discharge status: Home / Self Care | Payer: Medicare Other | Attending: Internal Medicine | Admitting: Internal Medicine

## 2022-12-16 ENCOUNTER — Encounter (HOSPITAL_COMMUNITY): Payer: Self-pay

## 2022-12-16 ENCOUNTER — Other Ambulatory Visit: Payer: Self-pay

## 2022-12-16 ENCOUNTER — Emergency Department (HOSPITAL_COMMUNITY): Payer: Medicare Other

## 2022-12-16 DIAGNOSIS — R55 Syncope and collapse: Secondary | ICD-10-CM | POA: Diagnosis not present

## 2022-12-16 DIAGNOSIS — K5733 Diverticulitis of large intestine without perforation or abscess with bleeding: Principal | ICD-10-CM | POA: Diagnosis present

## 2022-12-16 DIAGNOSIS — I480 Paroxysmal atrial fibrillation: Secondary | ICD-10-CM | POA: Diagnosis present

## 2022-12-16 DIAGNOSIS — Z79899 Other long term (current) drug therapy: Secondary | ICD-10-CM

## 2022-12-16 DIAGNOSIS — Z8249 Family history of ischemic heart disease and other diseases of the circulatory system: Secondary | ICD-10-CM

## 2022-12-16 DIAGNOSIS — E669 Obesity, unspecified: Secondary | ICD-10-CM | POA: Diagnosis present

## 2022-12-16 DIAGNOSIS — Z9049 Acquired absence of other specified parts of digestive tract: Secondary | ICD-10-CM

## 2022-12-16 DIAGNOSIS — K922 Gastrointestinal hemorrhage, unspecified: Secondary | ICD-10-CM | POA: Diagnosis present

## 2022-12-16 DIAGNOSIS — K625 Hemorrhage of anus and rectum: Principal | ICD-10-CM | POA: Diagnosis present

## 2022-12-16 DIAGNOSIS — K5732 Diverticulitis of large intestine without perforation or abscess without bleeding: Secondary | ICD-10-CM | POA: Diagnosis present

## 2022-12-16 DIAGNOSIS — Z888 Allergy status to other drugs, medicaments and biological substances status: Secondary | ICD-10-CM

## 2022-12-16 DIAGNOSIS — Z7982 Long term (current) use of aspirin: Secondary | ICD-10-CM

## 2022-12-16 DIAGNOSIS — Z8349 Family history of other endocrine, nutritional and metabolic diseases: Secondary | ICD-10-CM

## 2022-12-16 DIAGNOSIS — Z8261 Family history of arthritis: Secondary | ICD-10-CM

## 2022-12-16 DIAGNOSIS — Z6835 Body mass index (BMI) 35.0-35.9, adult: Secondary | ICD-10-CM

## 2022-12-16 DIAGNOSIS — E785 Hyperlipidemia, unspecified: Secondary | ICD-10-CM | POA: Diagnosis present

## 2022-12-16 DIAGNOSIS — Z825 Family history of asthma and other chronic lower respiratory diseases: Secondary | ICD-10-CM

## 2022-12-16 DIAGNOSIS — Z79890 Hormone replacement therapy (postmenopausal): Secondary | ICD-10-CM

## 2022-12-16 DIAGNOSIS — E039 Hypothyroidism, unspecified: Secondary | ICD-10-CM | POA: Diagnosis present

## 2022-12-16 LAB — COMPREHENSIVE METABOLIC PANEL
ALT: 16 U/L (ref 0–44)
AST: 26 U/L (ref 15–41)
Albumin: 3.7 g/dL (ref 3.5–5.0)
Alkaline Phosphatase: 37 U/L — ABNORMAL LOW (ref 38–126)
Anion gap: 11 (ref 5–15)
BUN: 16 mg/dL (ref 8–23)
CO2: 22 mmol/L (ref 22–32)
Calcium: 9.7 mg/dL (ref 8.9–10.3)
Chloride: 107 mmol/L (ref 98–111)
Creatinine, Ser: 1.03 mg/dL (ref 0.61–1.24)
GFR, Estimated: >60 mL/min (ref >60)
Glucose, Bld: 108 mg/dL — ABNORMAL HIGH (ref 70–99)
Potassium: 4.7 mmol/L (ref 3.5–5.1)
Sodium: 140 mmol/L (ref 135–145)
Total Bilirubin: 0.5 mg/dL (ref <1.2)
Total Protein: 6.7 g/dL (ref 6.5–8.1)

## 2022-12-16 LAB — CBC
HCT: 42 % (ref 39.0–52.0)
Hemoglobin: 13.6 g/dL (ref 13.0–17.0)
MCH: 28.2 pg (ref 26.0–34.0)
MCHC: 32.4 g/dL (ref 30.0–36.0)
MCV: 87.1 fL (ref 80.0–100.0)
Platelets: 247 10*3/uL (ref 150–400)
RBC: 4.82 MIL/uL (ref 4.22–5.81)
RDW: 14.7 % (ref 11.5–15.5)
WBC: 7.1 10*3/uL (ref 4.0–10.5)
nRBC: 0 % (ref 0.0–0.2)

## 2022-12-16 MED ORDER — SODIUM CHLORIDE 0.9 % IV BOLUS
1000.0000 mL | Freq: Once | INTRAVENOUS | Status: AC
Start: 2022-12-16 — End: 2022-12-16
  Administered 2022-12-16: 1000 mL via INTRAVENOUS

## 2022-12-16 NOTE — Discharge Instructions (Signed)
Please stop your metronidazole.  Continue your probiotics.  Clear liquid diet.  Monitor your symptoms and if you experience worsening bleeding, dizziness syncope, abdominal pain - return to the emergency department.  Otherwise contact Dr. Marlane Hatcher office on Monday for close follow-up.

## 2022-12-16 NOTE — ED Provider Notes (Signed)
[place] EMERGENCY DEPARTMENT AT [facility] Provider Note   CSN: [id number] Arrival date & time: 12/16/22  1655     History {Add pertinent medical, surgical, social history, OB history to HPI:1} Chief Complaint  Patient presents with   Rectal Bleeding    Newell Lerro is a 72 y.o. male.  He has a remote history of diverticulitis and ended up needing a couple of segments excised.  He was seen about a week ago for some loose stools increased gas along with lower abdominal pain and his CT showed acute uncomplicated diverticulitis.  He was discharged on pain medicine and Cipro Flagyl.  He said the medications were making him feel dizzy so he stopped taking the pain medicine.  He is finished the antibiotics.  He does not feel much better but today when he moved his bowels he had a grossly maroon liquid bowel movement.  It happened 2 more times since then.  This is new for patient.  He is on an aspirin but otherwise no anticoagulation.  Still feels little dizzy lightheaded.  No significant abdominal pain.  No other bleeding noted.  The history is provided by the patient.  Rectal Bleeding Quality:  Maroon Amount:  Moderate Duration:  1 day Timing:  Sporadic Chronicity:  New Context: defecation   Similar prior episodes: no   Relieved by:  None tried Worsened by:  Nothing Ineffective treatments:  None tried Associated symptoms: dizziness and light-headedness   Associated symptoms: no abdominal pain, no epistaxis, no fever, no hematemesis and no vomiting   Risk factors: hx of colorectal surgery        Home Medications Prior to Admission medications   Medication Sig Start Date End Date Taking? Authorizing Provider  Acetaminophen (TYLENOL) 325 MG CAPS Take 2 capsules by mouth daily as needed.    [provider]  aspirin EC 81 MG tablet Take 81 mg by mouth at bedtime. Swallow whole.    [provider]  bismuth subsalicylate (PEPTO BISMOL) 262 MG/15ML  suspension Take 30 mLs by mouth every 6 (six) hours as needed for indigestion or diarrhea or loose stools.    [provider]  ciprofloxacin (CIPRO) 500 MG tablet Take 1 tablet (500 mg total) by mouth every 12 (twelve) hours. 12/10/22   Elpidio Anis, PA-C  ezetimibe (ZETIA) 10 MG tablet Take 10 mg by mouth daily.    [provider]  fenofibrate (TRICOR) 145 MG tablet Take 145 mg by mouth daily.    [provider]  HYDROcodone-acetaminophen (NORCO) 5-325 MG tablet Take 1 tablet by mouth every 6 (six) hours as needed for moderate pain (pain score 4-6). 12/10/22   Elpidio Anis, PA-C  L-Theanine 200 MG CAPS Take 200 mg by mouth as needed.    [provider]  levothyroxine (SYNTHROID, LEVOTHROID) 50 MCG tablet Take 50-75 mcg by mouth daily before breakfast. 1.5 tablet on Monday, Wednesday and Friday; 1 tablet all other days of the week    [provider]  magnesium gluconate (MAGONATE) 500 MG tablet Take 1,000 mg by mouth at bedtime.    [provider]  Melatonin 10 MG CAPS Take 10 mg by mouth daily as needed (sleep).    [provider]  metroNIDAZOLE (FLAGYL) 500 MG tablet Take 1 tablet (500 mg total) by mouth 2 (two) times daily. 12/10/22   Elpidio Anis, PA-C  Multiple Vitamins-Minerals (PRESERVISION AREDS 2 PO) Take 1 each by mouth 2 (two) times daily.    [provider]  rosuvastatin (CRESTOR) 20 MG tablet TAKE ONE TABLET BY MOUTH DAILY Patient taking differently: Take 20 mg by mouth daily. 04/02/21   Corky Crafts, MD  simethicone (MYLICON) 125 MG chewable tablet Chew 125 mg by mouth every 6 (six) hours as needed for flatulence.    [provider]  sodium chloride (MURO 128) 2 % ophthalmic solution Place 1 drop into both eyes every 4 (four) hours as needed for eye irritation.    [provider]  Zinc Sulfate (ZINC 15 PO) Take 1 tablet by mouth 2 (two) times daily.    [provider]       Allergies    Atorvastatin    Review of Systems   Review of Systems  Constitutional:  Negative for fever.  HENT:  Negative for nosebleeds.   Respiratory:  Negative for shortness of breath.   Cardiovascular:  Negative for chest pain.  Gastrointestinal:  Positive for diarrhea and hematochezia. Negative for abdominal pain, hematemesis, rectal pain and vomiting.  Neurological:  Positive for dizziness and light-headedness.    Physical Exam Updated Vital Signs BP (!) 145/86   Pulse (!) 59   Temp 97.9 F (36.6 C) (Oral)   Resp 17   Ht 5\' 6"  (1.676 m)   Wt 102.1 kg   SpO2 98%   BMI 36.32 kg/m  Physical Exam Vitals and nursing note reviewed.  Constitutional:      General: He is not in acute distress.    Appearance: Normal appearance. He is well-developed.  HENT:     Head: Normocephalic and atraumatic.  Eyes:     Conjunctiva/sclera: Conjunctivae normal.  Cardiovascular:     Rate and Rhythm: Normal rate and regular rhythm.     Heart sounds: No murmur heard. Pulmonary:     Effort: Pulmonary effort is normal. No respiratory distress.     Breath sounds: Normal breath sounds.  Abdominal:     Palpations: Abdomen is soft.     Tenderness: There is no abdominal tenderness. There is no guarding or rebound.  Musculoskeletal:        General: No deformity. Normal range of motion.     Cervical back: Neck supple.  Skin:    General: Skin is warm and dry.     Capillary Refill: Capillary refill takes less than 2 seconds.  Neurological:     General: No focal deficit present.     Mental Status: He is alert.     ED Results / Procedures / Treatments   Labs (all labs ordered are listed, but only abnormal results are displayed) Labs Reviewed  COMPREHENSIVE METABOLIC PANEL - Abnormal; Notable for the following components:      Result Value   Glucose, Bld 108 (*)    Alkaline Phosphatase 37 (*)    All other components within normal limits  CBC    EKG None  Radiology No results  found.  Procedures Procedures  {Document cardiac monitor, telemetry assessment procedure when appropriate:1}  Medications Ordered in ED Medications - No data to display  ED Course/ Medical Decision Making/ A&P   {   Click here for ABCD2, HEART and other calculatorsREFRESH Note before signing :1}                              Medical Decision Making Amount and/or Complexity of Data Reviewed Labs: ordered.   This patient complains of ***; this involves an extensive number of treatment  Options and is a complaint that carries with it a high risk of complications and morbidity. The differential includes ***  I ordered, reviewed and interpreted labs, which included *** I ordered medication *** and reviewed PMP when indicated. I ordered imaging studies which included *** and I independently    visualized and interpreted imaging which showed *** Additional history obtained from *** Previous records obtained and reviewed *** I consulted *** and discussed lab and imaging findings and discussed disposition.  Cardiac monitoring reviewed, *** Social determinants considered, *** Critical Interventions: ***  After the interventions stated above, I reevaluated the patient and found *** Admission and further testing considered, ***   {Document critical care time when appropriate:1} {Document review of labs and clinical decision tools ie heart score, Chads2Vasc2 etc:1}  {Document your independent review of radiology images, and any outside records:1} {Document your discussion with family members, caretakers, and with consultants:1} {Document social determinants of health affecting pt's care:1} {Document your decision making why or why not admission, treatments were needed:1} Final Clinical Impression(s) / ED Diagnoses Final diagnoses:  None    Rx / DC Orders ED Discharge Orders     None

## 2022-12-16 NOTE — ED Triage Notes (Addendum)
Patient reports dark red rectal bleeding starting today. Denies abdominal pain, nausea, vomiting, diarrhea, and constipation. Recent dx of diverticulitis.

## 2022-12-17 ENCOUNTER — Encounter (HOSPITAL_COMMUNITY): Payer: Self-pay | Admitting: Internal Medicine

## 2022-12-17 DIAGNOSIS — E669 Obesity, unspecified: Secondary | ICD-10-CM | POA: Diagnosis present

## 2022-12-17 DIAGNOSIS — Z79890 Hormone replacement therapy (postmenopausal): Secondary | ICD-10-CM | POA: Diagnosis not present

## 2022-12-17 DIAGNOSIS — R55 Syncope and collapse: Secondary | ICD-10-CM | POA: Diagnosis present

## 2022-12-17 DIAGNOSIS — I480 Paroxysmal atrial fibrillation: Secondary | ICD-10-CM | POA: Diagnosis present

## 2022-12-17 DIAGNOSIS — K5733 Diverticulitis of large intestine without perforation or abscess with bleeding: Secondary | ICD-10-CM | POA: Diagnosis present

## 2022-12-17 DIAGNOSIS — K625 Hemorrhage of anus and rectum: Secondary | ICD-10-CM | POA: Diagnosis present

## 2022-12-17 DIAGNOSIS — Z79899 Other long term (current) drug therapy: Secondary | ICD-10-CM | POA: Diagnosis not present

## 2022-12-17 DIAGNOSIS — E785 Hyperlipidemia, unspecified: Secondary | ICD-10-CM | POA: Diagnosis present

## 2022-12-17 DIAGNOSIS — Z888 Allergy status to other drugs, medicaments and biological substances status: Secondary | ICD-10-CM | POA: Diagnosis not present

## 2022-12-17 DIAGNOSIS — Z8261 Family history of arthritis: Secondary | ICD-10-CM | POA: Diagnosis not present

## 2022-12-17 DIAGNOSIS — K922 Gastrointestinal hemorrhage, unspecified: Secondary | ICD-10-CM | POA: Diagnosis not present

## 2022-12-17 DIAGNOSIS — Z8249 Family history of ischemic heart disease and other diseases of the circulatory system: Secondary | ICD-10-CM | POA: Diagnosis not present

## 2022-12-17 DIAGNOSIS — Z7982 Long term (current) use of aspirin: Secondary | ICD-10-CM | POA: Diagnosis not present

## 2022-12-17 DIAGNOSIS — E039 Hypothyroidism, unspecified: Secondary | ICD-10-CM | POA: Diagnosis present

## 2022-12-17 DIAGNOSIS — Z9049 Acquired absence of other specified parts of digestive tract: Secondary | ICD-10-CM | POA: Diagnosis not present

## 2022-12-17 DIAGNOSIS — Z6835 Body mass index (BMI) 35.0-35.9, adult: Secondary | ICD-10-CM | POA: Diagnosis not present

## 2022-12-17 DIAGNOSIS — Z825 Family history of asthma and other chronic lower respiratory diseases: Secondary | ICD-10-CM | POA: Diagnosis not present

## 2022-12-17 DIAGNOSIS — Z8349 Family history of other endocrine, nutritional and metabolic diseases: Secondary | ICD-10-CM | POA: Diagnosis not present

## 2022-12-17 LAB — CBC
HCT: 38.7 % — ABNORMAL LOW (ref 39.0–52.0)
HCT: 35.5 % — ABNORMAL LOW (ref 39.0–52.0)
Hemoglobin: 12.1 g/dL — ABNORMAL LOW (ref 13.0–17.0)
Hemoglobin: 10.9 g/dL — ABNORMAL LOW (ref 13.0–17.0)
MCH: 27.5 pg (ref 26.0–34.0)
MCH: 27.7 pg (ref 26.0–34.0)
MCHC: 31.3 g/dL (ref 30.0–36.0)
MCHC: 30.7 g/dL (ref 30.0–36.0)
MCV: 88 fL (ref 80.0–100.0)
MCV: 90.1 fL (ref 80.0–100.0)
Platelets: 239 10*3/uL (ref 150–400)
Platelets: 161 10*3/uL (ref 150–400)
RBC: 4.4 MIL/uL (ref 4.22–5.81)
RBC: 3.94 MIL/uL — ABNORMAL LOW (ref 4.22–5.81)
RDW: 14.6 % (ref 11.5–15.5)
RDW: 14.7 % (ref 11.5–15.5)
WBC: 8.8 10*3/uL (ref 4.0–10.5)
WBC: 8.4 10*3/uL (ref 4.0–10.5)
nRBC: 0 % (ref 0.0–0.2)
nRBC: 0 % (ref 0.0–0.2)

## 2022-12-17 LAB — BASIC METABOLIC PANEL
Anion gap: 9 (ref 5–15)
BUN: 18 mg/dL (ref 8–23)
CO2: 22 mmol/L (ref 22–32)
Calcium: 8.8 mg/dL — ABNORMAL LOW (ref 8.9–10.3)
Chloride: 105 mmol/L (ref 98–111)
Creatinine, Ser: 1.04 mg/dL (ref 0.61–1.24)
GFR, Estimated: >60 mL/min (ref >60)
Glucose, Bld: 148 mg/dL — ABNORMAL HIGH (ref 70–99)
Potassium: 4 mmol/L (ref 3.5–5.1)
Sodium: 136 mmol/L (ref 135–145)

## 2022-12-17 LAB — TYPE AND SCREEN
ABO/RH(D): O POS
Antibody Screen: NEGATIVE

## 2022-12-17 MED ORDER — MAGNESIUM GLUCONATE 500 MG PO TABS: 1000.0000 mg | ORAL_TABLET | Freq: Every day | ORAL | Status: DC

## 2022-12-17 MED ORDER — FENOFIBRATE 160 MG PO TABS
160.0000 mg | ORAL_TABLET | Freq: Every day | ORAL | Status: DC
  Administered 2022-12-17 – 2022-12-18 (×2): 160 mg via ORAL
  Filled 2022-12-17 (×3): qty 1

## 2022-12-17 MED ORDER — MAGNESIUM CHLORIDE 64 MG PO TBEC
1.0000 | DELAYED_RELEASE_TABLET | Freq: Every day | ORAL | Status: DC
  Administered 2022-12-17: 64 mg via ORAL
  Filled 2022-12-17: qty 1

## 2022-12-17 MED ORDER — TIZANIDINE HCL 4 MG PO TABS
2.0000 mg | ORAL_TABLET | Freq: Once | ORAL | Status: AC
  Administered 2022-12-17: 2 mg via ORAL
  Filled 2022-12-17: qty 1

## 2022-12-17 MED ORDER — SODIUM CHLORIDE (HYPERTONIC) 2 % OP SOLN: 1.0000 [drp] | OPHTHALMIC | Status: DC | PRN

## 2022-12-17 MED ORDER — EZETIMIBE 10 MG PO TABS
10.0000 mg | ORAL_TABLET | Freq: Every day | ORAL | Status: DC
  Administered 2022-12-17 – 2022-12-18 (×2): 10 mg via ORAL
  Filled 2022-12-17 (×3): qty 1

## 2022-12-17 MED ORDER — ROSUVASTATIN CALCIUM 20 MG PO TABS
20.0000 mg | ORAL_TABLET | Freq: Every day | ORAL | Status: DC
  Administered 2022-12-17 – 2022-12-18 (×2): 20 mg via ORAL
  Filled 2022-12-17 (×2): qty 1

## 2022-12-17 MED ORDER — SODIUM CHLORIDE 0.9 % IV SOLN
2.0000 g | INTRAVENOUS | Status: AC
  Administered 2022-12-17 – 2022-12-18 (×2): 2 g via INTRAVENOUS
  Filled 2022-12-17 (×2): qty 20

## 2022-12-17 MED ORDER — ONDANSETRON HCL 4 MG PO TABS: 4.0000 mg | ORAL_TABLET | Freq: Four times a day (QID) | ORAL | Status: DC | PRN

## 2022-12-17 MED ORDER — LEVOTHYROXINE SODIUM 50 MCG PO TABS: 50.0000 ug | ORAL_TABLET | Freq: Every day | ORAL | Status: DC

## 2022-12-17 MED ORDER — ACETAMINOPHEN 325 MG PO TABS
650.0000 mg | ORAL_TABLET | Freq: Four times a day (QID) | ORAL | Status: DC | PRN
  Administered 2022-12-17: 650 mg via ORAL
  Filled 2022-12-17 (×2): qty 2

## 2022-12-17 MED ORDER — LEVOTHYROXINE SODIUM 75 MCG PO TABS
75.0000 ug | ORAL_TABLET | ORAL | Status: DC
Start: 2022-12-19 — End: 2022-12-18

## 2022-12-17 MED ORDER — MELATONIN 5 MG PO TABS
10.0000 mg | ORAL_TABLET | Freq: Every day | ORAL | Status: DC | PRN
  Administered 2022-12-17: 10 mg via ORAL
  Filled 2022-12-17: qty 2

## 2022-12-17 MED ORDER — ACETAMINOPHEN 650 MG RE SUPP: 650.0000 mg | Freq: Four times a day (QID) | RECTAL | Status: DC | PRN

## 2022-12-17 MED ORDER — LEVOTHYROXINE SODIUM 50 MCG PO TABS
50.0000 ug | ORAL_TABLET | ORAL | Status: DC
  Administered 2022-12-17 – 2022-12-18 (×2): 50 ug via ORAL
  Filled 2022-12-17 (×2): qty 1

## 2022-12-17 MED ORDER — METRONIDAZOLE 500 MG PO TABS
500.0000 mg | ORAL_TABLET | Freq: Two times a day (BID) | ORAL | Status: DC
  Filled 2022-12-17: qty 1

## 2022-12-17 MED ORDER — ONDANSETRON HCL 4 MG/2ML IJ SOLN: 4.0000 mg | Freq: Four times a day (QID) | INTRAMUSCULAR | Status: DC | PRN

## 2022-12-17 MED ORDER — METRONIDAZOLE 500 MG/100ML IV SOLN
500.0000 mg | Freq: Two times a day (BID) | INTRAVENOUS | Status: DC
  Administered 2022-12-17 – 2022-12-18 (×3): 500 mg via INTRAVENOUS
  Filled 2022-12-17 (×3): qty 100

## 2022-12-17 NOTE — ED Notes (Signed)
ED TO INPATIENT HANDOFF REPORT  ED Nurse Name and Phone #: Arnetha Gula Name/Age/Gender Eric Cordova 72 y.o. male Room/Bed: WA04/WA04  Code Status   Code Status: Full Code  Home/SNF/Other Home Patient oriented to: self, place, time, and situation Is this baseline? Yes   Triage Complete: Triage complete  Chief Complaint GI bleed [K92.2]  Triage Note Patient reports dark red rectal bleeding starting today. Denies abdominal pain, nausea, vomiting, diarrhea, and constipation. Recent dx of diverticulitis.    Allergies Allergies  Allergen Reactions   Atorvastatin Other (See Comments)    Muscle pain    Level of Care/Admitting Diagnosis ED Disposition     ED Disposition  Admit   Condition  --   Comment  Hospital Area: [facility] [[id number]]  Level of Care: Med-Surg [16]  May place patient in observation at [facility] or [facility] if equivalent level of care is available:: No  Covid Evaluation: Asymptomatic - no recent exposure (last 10 days) testing not required  Diagnosis: GI bleed [161096]  Admitting Physician: Hillary Bow [[id number]]  Attending Physician: Hillary Bow [[id number]]          B Medical/Surgery History 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    w/ irritation   Pneumonia    Past Surgical History:  Procedure Laterality Date   COLON SURGERY     CYSTOSCOPY N/A 12/26/2014   Procedure: CYSTOSCOPY;  Surgeon: Sebastian Ache, MD;  Location: [facility];  Service: Urology;  Laterality: N/A;   HERNIA REPAIR  1960, 1974   inguinal bilateral   HYDROCELE EXCISION Right 1984   INGUINAL HERNIA REPAIR Bilateral 1960 & 1974   INSERTION OF MESH  11/07/2011   Procedure: INSERTION OF MESH;  Surgeon: Cherylynn Ridges, MD;   Location: [facility] OR;  Service: General;  Laterality: N/A;   LAPAROSCOPIC SIGMOID COLECTOMY  11-12-2010   diverticulitis   NASAL SEPTOPLASTY W/ TURBINOPLASTY Bilateral 09/11/2018   Procedure: NASAL SEPTOPLASTY WITH BILATERAL TURBINATE REDUCTION;  Surgeon: Newman Pies, MD;  Location: [facility];  Service: ENT;  Laterality: Bilateral;   NASAL SEPTUM SURGERY  1982   PENILE BIOPSY N/A 12/26/2014   Procedure: PENILE  SKIN BIOPSY WITH PENILE BLOCK;  Surgeon: Sebastian Ache, MD;  Location: [facility];  Service: Urology;  Laterality: N/A;   PILONIDAL CYST EXCISION  1973   TONSILLECTOMY  1958   VENTRAL HERNIA REPAIR  11/07/2011   Procedure: LAPAROSCOPIC VENTRAL HERNIA;  Surgeon: Cherylynn Ridges, MD;  Location: [facility] OR;  Service: General;  Laterality: N/A;     A IV Location/Drains/Wounds Patient Lines/Drains/Airways Status     Active Line/Drains/Airways     Name Placement date Placement time Site Days   Peripheral IV 12/17/22 20 G Left Antecubital 12/17/22  0510  Antecubital  less than 1   Incision - 3 Ports Abdomen 1: Right;Upper 2: Right;Lower 3: Left;Lower 11/07/11  --  -- 4058            Intake/Output Last 24 hours No intake or output data in the 24 hours ending 12/17/22 0737  Labs/Imaging Results for orders placed or performed during the hospital encounter of 12/16/22 (from the past 48 hour(s))  CBC  Status: None   Collection Time: 12/16/22  6:05 PM  Result Value Ref Range   WBC 7.1 4.0 - 10.5 K/uL   RBC 4.82 4.22 - 5.81 MIL/uL   Hemoglobin 13.6 13.0 - 17.0 g/dL   HCT 16.1 09.6 - 04.5 %   MCV 87.1 80.0 - 100.0 fL   MCH 28.2 26.0 - 34.0 pg   MCHC 32.4 30.0 - 36.0 g/dL   RDW 40.9 81.1 - 91.4 %   Platelets 247 150 - 400 K/uL   nRBC 0.0 0.0 - 0.2 %    Comment: Performed at [facility], [address], [place], [place] [address]  Comprehensive metabolic panel     Status: Abnormal   Collection Time: 12/16/22  6:05 PM  Result Value Ref  Range   Sodium 140 135 - 145 mmol/L   Potassium 4.7 3.5 - 5.1 mmol/L   Chloride 107 98 - 111 mmol/L   CO2 22 22 - 32 mmol/L   Glucose, Bld 108 (H) 70 - 99 mg/dL    Comment: Glucose reference range applies only to samples taken after fasting for at least 8 hours.   BUN 16 8 - 23 mg/dL   Creatinine, Ser 6.21 0.61 - 1.24 mg/dL   Calcium 9.7 8.9 - 30.8 mg/dL   Total Protein 6.7 6.5 - 8.1 g/dL   Albumin 3.7 3.5 - 5.0 g/dL   AST 26 15 - 41 U/L   ALT 16 0 - 44 U/L   Alkaline Phosphatase 37 (L) 38 - 126 U/L   Total Bilirubin 0.5 <1.2 mg/dL   GFR, Estimated >65 >78 mL/min    Comment: (NOTE) Calculated using the CKD-EPI Creatinine Equation (2021)    Anion gap 11 5 - 15    Comment: Performed at [facility], [address] [place], [place] [address]  CBC     Status: Abnormal   Collection Time: 12/17/22  5:21 AM  Result Value Ref Range   WBC 8.8 4.0 - 10.5 K/uL   RBC 4.40 4.22 - 5.81 MIL/uL   Hemoglobin 12.1 (L) 13.0 - 17.0 g/dL   HCT 95.2 (L) 84.1 - 32.4 %   MCV 88.0 80.0 - 100.0 fL   MCH 27.5 26.0 - 34.0 pg   MCHC 31.3 30.0 - 36.0 g/dL   RDW 40.1 02.7 - 25.3 %   Platelets 239 150 - 400 K/uL   nRBC 0.0 0.0 - 0.2 %    Comment: Performed at [facility], [address], [place], [place] [address]  Basic metabolic panel     Status: Abnormal   Collection Time: 12/17/22  5:21 AM  Result Value Ref Range   Sodium 136 135 - 145 mmol/L   Potassium 4.0 3.5 - 5.1 mmol/L   Chloride 105 98 - 111 mmol/L   CO2 22 22 - 32 mmol/L   Glucose, Bld 148 (H) 70 - 99 mg/dL    Comment: Glucose reference range applies only to samples taken after fasting for at least 8 hours.   BUN 18 8 - 23 mg/dL   Creatinine, Ser 3.47 0.61 - 1.24 mg/dL   Calcium 8.8 (L) 8.9 - 10.3 mg/dL   GFR, Estimated >42 >59 mL/min    Comment: (NOTE) Calculated using the CKD-EPI Creatinine Equation (2021)    Anion gap 9 5 - 15    Comment: Performed at [facility], [address] [place], [place] [address]   CT Head Wo Contrast  Result Date: 12/16/2022 CLINICAL DATA:  Fall EXAM: CT HEAD WITHOUT CONTRAST CT CERVICAL SPINE WITHOUT CONTRAST TECHNIQUE: Multidetector CT imaging of the head and cervical spine was performed following the standard protocol without intravenous contrast. Multiplanar CT image reconstructions of the cervical spine were also generated. RADIATION DOSE REDUCTION: This exam was performed according to the departmental dose-optimization program which includes automated exposure control, adjustment of the mA and/or kV according to patient size and/or use of iterative reconstruction technique. COMPARISON:  None Available. FINDINGS: CT HEAD FINDINGS Brain: There is no mass, hemorrhage or extra-axial collection. There is generalized atrophy without lobar predilection. The brain parenchyma is normal, without evidence of acute or chronic infarction. Vascular: No abnormal hyperdensity of the major intracranial arteries or dural venous sinuses. No intracranial atherosclerosis. Skull: The visualized skull base, calvarium and extracranial soft tissues are normal. Sinuses/Orbits: No fluid levels or advanced mucosal thickening of the visualized paranasal sinuses. No mastoid or middle ear effusion. The orbits are normal. CT CERVICAL SPINE FINDINGS Alignment: No static subluxation. Facets are aligned. Occipital condyles are normally positioned. Skull base and vertebrae: No acute fracture. Soft tissues and spinal canal: No prevertebral fluid or swelling. No visible canal hematoma. Disc levels: No advanced spinal canal or neural foraminal stenosis. Upper chest: No pneumothorax, pulmonary nodule or pleural effusion. Other: Normal visualized paraspinal cervical soft tissues. IMPRESSION: 1. No acute intracranial abnormality. 2. No acute fracture or static subluxation of the cervical spine. Electronically Signed   By: Deatra Robinson M.D.   On: 12/16/2022 22:17   CT Cervical Spine  Wo Contrast  Result Date: 12/16/2022 CLINICAL DATA:  Fall EXAM: CT HEAD WITHOUT CONTRAST CT CERVICAL SPINE WITHOUT CONTRAST TECHNIQUE: Multidetector CT imaging of the head and cervical spine was performed following the standard protocol without intravenous contrast. Multiplanar CT image reconstructions of the cervical spine were also generated. RADIATION DOSE REDUCTION: This exam was performed according to the departmental dose-optimization program which includes automated exposure control, adjustment of the mA and/or kV according to patient size and/or use of iterative reconstruction technique. COMPARISON:  None Available. FINDINGS: CT HEAD FINDINGS Brain: There is no mass, hemorrhage or extra-axial collection. There is generalized atrophy without lobar predilection. The brain parenchyma is normal, without evidence of acute or chronic infarction. Vascular: No abnormal hyperdensity of the major intracranial arteries or dural venous sinuses. No intracranial atherosclerosis. Skull: The visualized skull base, calvarium and extracranial soft tissues are normal. Sinuses/Orbits: No fluid levels or advanced mucosal thickening of the visualized paranasal sinuses. No mastoid or middle ear effusion. The orbits are normal. CT CERVICAL SPINE FINDINGS Alignment: No static subluxation. Facets are aligned. Occipital condyles are normally positioned. Skull base and vertebrae: No acute fracture. Soft tissues and spinal canal: No prevertebral fluid or swelling. No visible canal hematoma. Disc levels: No advanced spinal canal or neural foraminal stenosis. Upper chest: No pneumothorax, pulmonary nodule or pleural effusion. Other: Normal visualized paraspinal cervical soft tissues. IMPRESSION: 1. No acute intracranial abnormality. 2. No acute fracture or static subluxation of the cervical spine. Electronically Signed   By: Deatra Robinson M.D.   On: 12/16/2022 22:17    Pending Labs Unresulted Labs (From admission, onward)     Start      Ordered   12/17/22 0427  Type and screen [facility]  Once,   R       Comments: [facility]    12/17/22 0426   Pending  CBC  Once,   R  Pending            Vitals/Pain Today's Vitals   12/17/22 0209 12/17/22 0400 12/17/22 0409 12/17/22 0528  BP:   (!) 166/75   Pulse:   63   Resp:   18   Temp:   97.8 F (36.6 C)   TempSrc:   Oral   SpO2:   100%   Weight:      Height:      PainSc: 1  0-No pain  8     Isolation Precautions No active isolations  Medications Medications  acetaminophen (TYLENOL) tablet 650 mg (650 mg Oral Given 12/17/22 0533)    Or  acetaminophen (TYLENOL) suppository 650 mg ( Rectal See Alternative 12/17/22 0533)  ondansetron (ZOFRAN) tablet 4 mg (has no administration in time range)    Or  ondansetron (ZOFRAN) injection 4 mg (has no administration in time range)  rosuvastatin (CRESTOR) tablet 20 mg (has no administration in time range)  ezetimibe (ZETIA) tablet 10 mg (has no administration in time range)  fenofibrate tablet 160 mg (has no administration in time range)  melatonin tablet 10 mg (has no administration in time range)  sodium chloride (MURO 128) 2 % ophthalmic solution 1 drop (has no administration in time range)  levothyroxine (SYNTHROID) tablet 75 mcg (has no administration in time range)    And  levothyroxine (SYNTHROID) tablet 50 mcg (50 mcg Oral Given 12/17/22 0525)  magnesium chloride (SLOW-MAG) 64 MG SR tablet 64 mg (has no administration in time range)  cefTRIAXone (ROCEPHIN) 2 g in sodium chloride 0.9 % 100 mL IVPB (2 g Intravenous New Bag/Given 12/17/22 0523)  metroNIDAZOLE (FLAGYL) tablet 500 mg (has no administration in time range)  sodium chloride 0.9 % bolus 1,000 mL (1,000 mLs Intravenous New Bag/Given 12/16/22 2154)    Mobility walks     Focused Assessments Cardiac Assessment Handoff:    No results found for: "CKTOTAL", "CKMB", "CKMBINDEX", "TROPONINI" No results found for:  "DDIMER" Does the Patient currently have chest pain? No    R Recommendations: See Admitting Provider Note  Report given to:   Additional Notes:

## 2022-12-17 NOTE — Assessment & Plan Note (Addendum)
On chart, but dont see that pt on any rate control meds, nor AC (just as well that he isnt on [facility] since he's having a GIB today).

## 2022-12-17 NOTE — Assessment & Plan Note (Signed)
Pt with GIB with maroon stool. ? Diverticular bleed given recent diverticulitis this past week?  Or could be UGIB. Repeat CBC in AM Type and screen Clear liquid diet Message sent to Dr. Ewing Schlein If bleeding persists / worsens: would obtain CTA GIB study.

## 2022-12-17 NOTE — Assessment & Plan Note (Addendum)
Pt with diverticulitis diagnosed last week, now once again has diverticulitis following a prior partial colectomy for diverticulitis in past. Has taken ~5 days of ABx thus far he estimates with 1-2 days left. Abd pain improved some but now having GIB. Will continue flagyl Will hold cipro while in hospital and just use rocephin instead.

## 2022-12-17 NOTE — Consult Note (Signed)
Reason for Consult: GI bleed Referring Physician: Hospital team  Eric Cordova is an 72 y.o. male.  HPI: Patient seen and examined and case discussed with the ER physician and his hospital computer chart as well as his office computer chart was reviewed and I had done his last colonoscopy in August 2021 which does show diverticuli and his sigmoid resection which was 2008 and he had done well until recently and had his first bout of diverticulitis since then and his CT was reviewed and had been on antibiotics for about 5 days when he had some bright red blood per rectum without clots and his pain has resolved and unfortunately after a bowel movement in the ER he got dizzy with standing and passed out and was admitted for further workup plans and observation and currently today he is doing better and his bleeding seems to have decreased and he is on an aspirin a day at home but no additional nonsteroidals 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    w/ irritation   Pneumonia     Past Surgical History:  Procedure Laterality Date   COLON SURGERY     CYSTOSCOPY N/A 12/26/2014   Procedure: CYSTOSCOPY;  Surgeon: Sebastian Ache, MD;  Location: [facility];  Service: Urology;  Laterality: N/A;   HERNIA REPAIR  1960, 1974   inguinal bilateral   HYDROCELE EXCISION Right 1984   INGUINAL HERNIA REPAIR Bilateral 1960 & 1974   INSERTION OF MESH  11/07/2011   Procedure: INSERTION OF MESH;  Surgeon: Cherylynn Ridges, MD;  Location: [facility];  Service: General;  Laterality: N/A;   LAPAROSCOPIC SIGMOID COLECTOMY  11-12-2010   diverticulitis   NASAL SEPTOPLASTY W/ TURBINOPLASTY Bilateral 09/11/2018   Procedure: NASAL SEPTOPLASTY WITH BILATERAL TURBINATE REDUCTION;  Surgeon: Newman Pies, MD;  Location: [facility];  Service: ENT;  Laterality: Bilateral;   NASAL SEPTUM SURGERY  1982   PENILE BIOPSY N/A 12/26/2014   Procedure: PENILE  SKIN BIOPSY WITH PENILE BLOCK;  Surgeon: Sebastian Ache, MD;  Location: [facility];  Service: Urology;  Laterality: N/A;   PILONIDAL CYST EXCISION  1973   TONSILLECTOMY  1958   VENTRAL HERNIA REPAIR  11/07/2011   Procedure: LAPAROSCOPIC VENTRAL HERNIA;  Surgeon: Cherylynn Ridges, MD;  Location: [facility];  Service: General;  Laterality: N/A;    Family History  Problem Relation Age of Onset   Cancer Mother        breast   Thyroid disease Mother    Asthma Mother    Cancer Father        prostate   Dementia Father    Heart disease Father    Cancer Maternal Uncle        prostate   Hypertension Brother    Arthritis Maternal Grandmother     Social History:  reports that he has never smoked. He has never used smokeless tobacco. He reports current alcohol use of about 7.0 - 14.0 standard drinks of alcohol per week. He reports that he does not use drugs.  Allergies:  Allergies  Allergen Reactions   Atorvastatin Other (See Comments)    Muscle pain    Medications: I have reviewed the patient's current medications.  Results for orders placed or performed during the hospital encounter of 12/16/22 (from the past 48 hour(s))  CBC     Status: None   Collection Time: 12/16/22  6:05 PM  Result Value Ref Range   WBC 7.1 4.0 - 10.5 K/uL   RBC 4.82 4.22 - 5.81 MIL/uL   Hemoglobin 13.6 13.0 - 17.0 g/dL   HCT 52.8 41.3 - 24.4 %   MCV 87.1 80.0 - 100.0 fL   MCH 28.2 26.0 - 34.0 pg   MCHC 32.4 30.0 - 36.0 g/dL   RDW 01.0 27.2 - 53.6 %   Platelets 247 150 - 400 K/uL   nRBC 0.0 0.0 - 0.2 %    Comment: Performed at [facility], [address], [place], [place] [address]  Comprehensive metabolic panel     Status: Abnormal   Collection Time: 12/16/22  6:05 PM  Result Value Ref Range   Sodium 140 135 - 145 mmol/L   Potassium 4.7  3.5 - 5.1 mmol/L   Chloride 107 98 - 111 mmol/L   CO2 22 22 - 32 mmol/L   Glucose, Bld 108 (H) 70 - 99 mg/dL    Comment: Glucose reference range applies only to samples taken after fasting for at least 8 hours.   BUN 16 8 - 23 mg/dL   Creatinine, Ser 4.74 0.61 - 1.24 mg/dL   Calcium 9.7 8.9 - 25.9 mg/dL   Total Protein 6.7 6.5 - 8.1 g/dL   Albumin 3.7 3.5 - 5.0 g/dL   AST 26 15 - 41 U/L   ALT 16 0 - 44 U/L   Alkaline Phosphatase 37 (L) 38 - 126 U/L   Total Bilirubin 0.5 <1.2 mg/dL   GFR, Estimated >56 >38 mL/min    Comment: (NOTE) Calculated using the CKD-EPI Creatinine Equation (2021)    Anion gap 11 5 - 15    Comment: Performed at [facility], [address] [place], [place] [address]  CBC     Status: Abnormal   Collection Time: 12/17/22  5:21 AM  Result Value Ref Range   WBC 8.8 4.0 - 10.5 K/uL   RBC 4.40 4.22 - 5.81 MIL/uL   Hemoglobin 12.1 (L) 13.0 - 17.0 g/dL   HCT 32.9 (L) 51.8 - 84.1 %   MCV 88.0 80.0 - 100.0 fL   MCH 27.5 26.0 - 34.0 pg   MCHC 31.3 30.0 - 36.0 g/dL   RDW 66.0 63.0 - 16.0 %   Platelets 239 150 - 400 K/uL   nRBC 0.0 0.0 - 0.2 %    Comment: Performed at [facility], [address] [place], [place] [address]  Basic metabolic panel     Status: Abnormal   Collection Time: 12/17/22  5:21 AM  Result Value Ref Range   Sodium 136 135 - 145 mmol/L   Potassium 4.0 3.5 - 5.1 mmol/L   Chloride 105 98 - 111 mmol/L   CO2 22 22 - 32 mmol/L   Glucose, Bld 148 (H) 70 - 99 mg/dL    Comment: Glucose reference range applies only to samples taken after fasting for at least 8 hours.   BUN 18 8 - 23 mg/dL   Creatinine, Ser 3.55 0.61 - 1.24 mg/dL   Calcium 8.8 (L) 8.9 - 10.3 mg/dL   GFR, Estimated >73 >22 mL/min    Comment: (NOTE) Calculated using the CKD-EPI Creatinine Equation (2021)    Anion gap 9 5 - 15  Comment: Performed at [facility], [address], [place], [place] [address]  Type and screen  [facility]     Status: None   Collection Time: 12/17/22  5:21 AM  Result Value Ref Range   ABO/RH(D) O POS    Antibody Screen NEG    Sample Expiration      12/20/2022,2359 Performed at [facility], [address] [place], [place] [address]     CT Head Wo Contrast  Result Date: 12/16/2022 CLINICAL DATA:  Fall EXAM: CT HEAD WITHOUT CONTRAST CT CERVICAL SPINE WITHOUT CONTRAST TECHNIQUE: Multidetector CT imaging of the head and cervical spine was performed following the standard protocol without intravenous contrast. Multiplanar CT image reconstructions of the cervical spine were also generated. RADIATION DOSE REDUCTION: This exam was performed according to the departmental dose-optimization program which includes automated exposure control, adjustment of the mA and/or kV according to patient size and/or use of iterative reconstruction technique. COMPARISON:  None Available. FINDINGS: CT HEAD FINDINGS Brain: There is no mass, hemorrhage or extra-axial collection. There is generalized atrophy without lobar predilection. The brain parenchyma is normal, without evidence of acute or chronic infarction. Vascular: No abnormal hyperdensity of the major intracranial arteries or dural venous sinuses. No intracranial atherosclerosis. Skull: The visualized skull base, calvarium and extracranial soft tissues are normal. Sinuses/Orbits: No fluid levels or advanced mucosal thickening of the visualized paranasal sinuses. No mastoid or middle ear effusion. The orbits are normal. CT CERVICAL SPINE FINDINGS Alignment: No static subluxation. Facets are aligned. Occipital condyles are normally positioned. Skull base and vertebrae: No acute fracture. Soft tissues and spinal canal: No prevertebral fluid or swelling. No visible canal hematoma. Disc levels: No advanced spinal canal or neural foraminal stenosis. Upper chest: No pneumothorax, pulmonary nodule or pleural effusion. Other:  Normal visualized paraspinal cervical soft tissues. IMPRESSION: 1. No acute intracranial abnormality. 2. No acute fracture or static subluxation of the cervical spine. Electronically Signed   By: Deatra Robinson M.D.   On: 12/16/2022 22:17   CT Cervical Spine Wo Contrast  Result Date: 12/16/2022 CLINICAL DATA:  Fall EXAM: CT HEAD WITHOUT CONTRAST CT CERVICAL SPINE WITHOUT CONTRAST TECHNIQUE: Multidetector CT imaging of the head and cervical spine was performed following the standard protocol without intravenous contrast. Multiplanar CT image reconstructions of the cervical spine were also generated. RADIATION DOSE REDUCTION: This exam was performed according to the departmental dose-optimization program which includes automated exposure control, adjustment of the mA and/or kV according to patient size and/or use of iterative reconstruction technique. COMPARISON:  None Available. FINDINGS: CT HEAD FINDINGS Brain: There is no mass, hemorrhage or extra-axial collection. There is generalized atrophy without lobar predilection. The brain parenchyma is normal, without evidence of acute or chronic infarction. Vascular: No abnormal hyperdensity of the major intracranial arteries or dural venous sinuses. No intracranial atherosclerosis. Skull: The visualized skull base, calvarium and extracranial soft tissues are normal. Sinuses/Orbits: No fluid levels or advanced mucosal thickening of the visualized paranasal sinuses. No mastoid or middle ear effusion. The orbits are normal. CT CERVICAL SPINE FINDINGS Alignment: No static subluxation. Facets are aligned. Occipital condyles are normally positioned. Skull base and vertebrae: No acute fracture. Soft tissues and spinal canal: No prevertebral fluid or swelling. No visible canal hematoma. Disc levels: No advanced spinal canal or neural foraminal stenosis. Upper chest: No pneumothorax, pulmonary nodule or pleural effusion. Other: Normal visualized paraspinal cervical soft  tissues. IMPRESSION: 1. No acute intracranial abnormality. 2. No acute fracture or  static subluxation of the cervical spine. Electronically Signed   By: Deatra Robinson M.D.   On: 12/16/2022 22:17    ROS negative except above Blood pressure (!) 151/73, pulse (!) 54, temperature 97.9 F (36.6 C), temperature source Oral, resp. rate 18, height 5\' 6"  (1.676 m), weight 98.8 kg, SpO2 97%. Physical Exam vital signs stable afebrile no acute distress abdomen is soft nontender hemoglobin slight drop still 12.1 BUN and creatinine okay  Assessment/Plan: Almost certainly diverticular bleeding Plan: He probably only needs 3 more days of antibiotics for his seemingly resolved diverticulitis and will continue clear liquids today and if no signs of bleeding today can have soft solids tomorrow and possibly go home tomorrow evening and please call me if any GI question or problem otherwise I will check on tomorrow and will need to hold aspirin at home for a week or 2 and reevaluate whether he truly needs 1 at home at all going forward  [facility] E 12/17/2022, 10:41 AM

## 2022-12-17 NOTE — Progress Notes (Signed)
[facility] PROGRESS NOTE   Eric Cordova ZOX:[id number] DOB: 1950-11-30 DOA: 12/16/2022  PCP: Alysia Penna, MD  Brief History: 72 y.o. male with medical history significant of diverticulitis s/p partial colectomy, HLD. Pt diagnosed with diverticulitis of colon last weekend on 11/30.  Started on cipro/flagyl of which he has taken maybe 5 doses.  Did have improvement in his symptoms but then subsequently started having bloody bowel movements.  Came back to the emergency department and was hospitalized for further management.  Consultants: [facility]  Procedures: None yet    Subjective/Interval History: Patient mentioned that last bowel movement was around 11:00 last night which was bloody.  Denies any abdominal pain.  No nausea vomiting.  Takes aspirin daily.  Not on any other anticoagulants.   Assessment/Plan:  Hematochezia This is in the setting of recently diagnosed diverticulitis. Bleeding is most likely diverticular in origin as he does not have any abdominal pain currently. Recently done CT scan of the abdomen pelvis showed acute uncomplicated diverticulitis.  His abdomen is fairly benign on examination. Monitor hemoglobin. GI has been notified.  Acute diverticulitis This was diagnosed on 12/1.  Patient was discharged on ciprofloxacin and Flagyl.  Changed over to ceftriaxone and Flagyl.  Abdomen is benign.  WBC is normal.  He is afebrile.  No clear indication to repeat CT scan at this time.  Paroxysmal atrial fibrillation This occurred approximately 3 years ago.  Not on anticoagulation just on aspirin.  Aspirin currently on hold.  Currently in sinus rhythm.  Hypothyroidism Continue with levothyroxine.  Hyperlipidemia Continue with statin and Zetia.  Obesity Estimated body mass index is 35.16 kg/m as calculated from the following:   Height as of this encounter: 5\' 6"  (1.676 m).   Weight as of this encounter: 98.8 kg.   DVT Prophylaxis:  SCDs Code Status: Full code Family Communication: Discussed with the patient Disposition Plan: Hopefully return home when improved  Status is: Observation The patient will require care spanning > 2 midnights and should be moved to inpatient because: Diverticular bleed      Medications: Scheduled:  ezetimibe  10 mg Oral Daily   fenofibrate  160 mg Oral Daily   [START ON 12/19/2022] levothyroxine  75 mcg Oral Once per day on Monday Wednesday Friday   And   levothyroxine  50 mcg Oral Once per day on Sunday Tuesday Thursday Saturday   magnesium chloride  1 tablet Oral QHS   metroNIDAZOLE  500 mg Oral Q12H   rosuvastatin  20 mg Oral Daily   Continuous:  cefTRIAXone (ROCEPHIN)  IV 2 g (12/17/22 0523)   PRN:acetaminophen **OR** acetaminophen, melatonin, ondansetron **OR** ondansetron (ZOFRAN) IV, sodium chloride  Antibiotics: Anti-infectives (From admission, onward)    Start     Dose/Rate Route Frequency Ordered Stop   12/17/22 1000  metroNIDAZOLE (FLAGYL) tablet 500 mg        50 0 mg Oral Every 12 hours 12/17/22 0413 12/19/22 0959   12/17/22 0415  cefTRIAXone (ROCEPHIN) 2 g in sodium chloride 0.9 % 100 mL IVPB        2 g 200 mL/hr over 30 Minutes Intravenous Every 24 hours 12/17/22 0413 12/19/22 0414       Objective:  Vital Signs  Vitals:   12/16/22 2312 12/17/22 0409 12/17/22 0700 12/17/22 0812  BP: (!) 165/81 (!) 166/75  (!) 151/73  Pulse: (!) 58 63  (!) 54  Resp: 16 18    Temp: (!) 97.5 F (36.4 C) 97.8 F (36.6 C)  97.9 F (36.6 C)  TempSrc: Oral Oral  Oral  SpO2: 100% 100%  97%  Weight:   98.8 kg   Height:   5\' 6"  (1.676 m)    No intake or output data in the 24 hours ending 12/17/22 0918 Filed Weights   12/16/22 1723 12/16/22 1739 12/17/22 0700  Weight: 101.8 kg 102.1 kg 98.8 kg    General appearance: Awake alert.  In no distress Resp: Clear to auscultation bilaterally.  Normal effort Cardio: S1-S2 is normal regular.  No S3-S4.  No rubs murmurs or  bruit GI: Abdomen is soft.  My tender in the left lower quadrant and suprapubic area without any rebound rigidity or guarding.  No masses organomegaly. Extremities: No edema.  Full range of motion of lower extremities. Neurologic: Alert and oriented x3.  No focal neurological deficits.    Lab Results:  Data Reviewed: I have personally reviewed following labs and reports of the imaging studies  CBC: Recent Labs  Lab 12/10/22 1157 12/16/22 1805 12/17/22 0521  WBC 6.5 7.1 8.8  NEUTROABS 3.8  --   --   HGB 13.1 13.6 12.1*  HCT 39.1 42.0 38.7*  MCV 85.6 87.1 88.0  PLT 190 247 239    Basic Metabolic Panel: Recent Labs  Lab 12/10/22 1157 12/16/22 1805 12/17/22 0521  NA 139 140 136  K 4.0 4.7 4.0  CL 106 107 105  CO2 23 22 22   GLUCOSE 116* 108* 148*  BUN 16 16 18   CREATININE 1.06 1.03 1.04  CALCIUM 9.3 9.7 8.8*    GFR: Estimated Creatinine Clearance: 71.7 mL/min (by C-G formula based on SCr of 1.04 mg/dL).  Liver Function Tests: Recent Labs  Lab 12/10/22 1157 12/16/22 1805  AST 18 26  ALT 15 16  ALKPHOS 33* 37*  BILITOT 0.3 0.5  PROT 6.8 6.7  ALBUMIN 3.6 3.7    Recent Labs  Lab 12/10/22 1157  LIPASE 33     Radiology Studies: CT Head Wo Contrast  Result Date: 12/16/2022 CLINICAL DATA:  Fall EXAM: CT HEAD WITHOUT CONTRAST CT CERVICAL SPINE WITHOUT CONTRAST TECHNIQUE: Multidetector CT imaging of the head and cervical spine was performed following the standard protocol without intravenous contrast. Multiplanar CT image reconstructions of the cervical spine were also generated. RADIATION DOSE REDUCTION: This exam was performed according to the departmental dose-optimization program which includes automated exposure control, adjustment of the mA and/or kV according to patient size and/or use of iterative reconstruction technique. COMPARISON:  None Available. FINDINGS: CT HEAD FINDINGS Brain: There is no mass, hemorrhage or extra-axial collection. There is  generalized atrophy without lobar predilection. The brain parenchyma is normal, without evidence of acute or chronic infarction. Vascular: No abnormal hyperdensity of the major intracranial arteries or dural venous sinuses. No intracranial atherosclerosis. Skull: The visualized skull base, calvarium and extracranial soft tissues are normal. Sinuses/Orbits: No fluid levels or advanced mucosal thickening of the visualized paranasal sinuses. No mastoid or middle ear effusion. The orbits are normal. CT CERVICAL SPINE FINDINGS Alignment: No static subluxation. Facets are aligned. Occipital condyles are normally positioned. Skull base and vertebrae: No acute fracture. Soft tissues and spinal canal: No prevertebral fluid or swelling. No visible canal hematoma. Disc levels: No advanced spinal canal or neural foraminal stenosis. Upper chest: No pneumothorax, pulmonary nodule or pleural effusion. Other: Normal visualized paraspinal cervical soft tissues. IMPRESSION: 1. No acute intracranial abnormality. 2. No acute fracture or static subluxation of the cervical spine. Electronically Signed   By: Deatra Robinson  M.D.   On: 12/16/2022 22:17   CT Cervical Spine Wo Contrast  Result Date: 12/16/2022 CLINICAL DATA:  Fall EXAM: CT HEAD WITHOUT CONTRAST CT CERVICAL SPINE WITHOUT CONTRAST TECHNIQUE: Multidetector CT imaging of the head and cervical spine was performed following the standard protocol without intravenous contrast. Multiplanar CT image reconstructions of the cervical spine were also generated. RADIATION DOSE REDUCTION: This exam was performed according to the departmental dose-optimization program which includes automated exposure control, adjustment of the mA and/or kV according to patient size and/or use of iterative reconstruction technique. COMPARISON:  None Available. FINDINGS: CT HEAD FINDINGS Brain: There is no mass, hemorrhage or extra-axial collection. There is generalized atrophy without lobar predilection.  The brain parenchyma is normal, without evidence of acute or chronic infarction. Vascular: No abnormal hyperdensity of the major intracranial arteries or dural venous sinuses. No intracranial atherosclerosis. Skull: The visualized skull base, calvarium and extracranial soft tissues are normal. Sinuses/Orbits: No fluid levels or advanced mucosal thickening of the visualized paranasal sinuses. No mastoid or middle ear effusion. The orbits are normal. CT CERVICAL SPINE FINDINGS Alignment: No static subluxation. Facets are aligned. Occipital condyles are normally positioned. Skull base and vertebrae: No acute fracture. Soft tissues and spinal canal: No prevertebral fluid or swelling. No visible canal hematoma. Disc levels: No advanced spinal canal or neural foraminal stenosis. Upper chest: No pneumothorax, pulmonary nodule or pleural effusion. Other: Normal visualized paraspinal cervical soft tissues. IMPRESSION: 1. No acute intracranial abnormality. 2. No acute fracture or static subluxation of the cervical spine. Electronically Signed   By: Deatra Robinson M.D.   On: 12/16/2022 22:17       LOS: 0 days   Eric Cordova Eric Cordova  [facility] Pager on [email or web address]  12/17/2022, 9:18 AM

## 2022-12-17 NOTE — ED Notes (Signed)
Attempted to get labs but Iv wouldn't draw back, mentioned to pt that I would have to put new IV. Pt states it was fine.

## 2022-12-17 NOTE — ED Notes (Signed)
Pt refused to get IV put with ultrasound and and stated to staff that wanted to get transferred to another hospital. Provider made aware.

## 2022-12-17 NOTE — Plan of Care (Signed)
  Problem: Education: Goal: Knowledge of General Education information will improve Description: Including pain rating scale, medication(s)/side effects and non-pharmacologic comfort measures Outcome: Progressing   Problem: Health Behavior/Discharge Planning: Goal: Ability to manage health-related needs will improve Outcome: Progressing   Problem: Clinical Measurements: Goal: Ability to maintain clinical measurements within normal limits will improve Outcome: Progressing Goal: Will remain free from infection Outcome: Progressing Goal: Diagnostic test results will improve Outcome: Progressing Goal: Respiratory complications will improve Outcome: Progressing Goal: Cardiovascular complication will be avoided Outcome: Progressing   Problem: Activity: Goal: Risk for activity intolerance will decrease Outcome: Progressing   Problem: Nutrition: Goal: Adequate nutrition will be maintained Outcome: Progressing   Problem: Coping: Goal: Level of anxiety will decrease Outcome: Progressing   Problem: Elimination: Goal: Will not experience complications related to urinary retention Outcome: Progressing   Problem: Pain Management: Goal: General experience of comfort will improve Outcome: Progressing   Problem: Safety: Goal: Ability to remain free from injury will improve Outcome: Progressing   Problem: Skin Integrity: Goal: Risk for impaired skin integrity will decrease Outcome: Progressing

## 2022-12-17 NOTE — H&P (Signed)
History and Physical    Patient: Eric Cordova XBM:[id number] DOB: July 28, 1950 DOA: 12/16/2022 DOS: the patient was seen and examined on 12/17/2022 PCP: Alysia Penna, MD  Patient coming from: Home  Chief Complaint:  Chief Complaint  Patient presents with   Rectal Bleeding   HPI: Eric Cordova is a 72 y.o. male with medical history significant of diverticulitis s/p partial colectomy, HLD.  Pt diagnosed with diverticulitis of colon last weekend on 11/30.  Started on cipro/flagyl of which he has taken maybe 5 doses.  Some improvement in abd pain and symptoms; however, today he began to have frequent bloody stools.  Maroon in color multiple episodes.  HGB 13 in ED, were going to try and send him home with outpt follow up, but he had orthostatic syncope while here in the bathroom.   Review of Systems: As mentioned in the history of present illness. All other systems reviewed and are negative. 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    w/ irritation   Pneumonia    Past Surgical History:  Procedure Laterality Date   COLON SURGERY     CYSTOSCOPY N/A 12/26/2014   Procedure: CYSTOSCOPY;  Surgeon: Sebastian Ache, MD;  Location: [facility];  Service: Urology;  Laterality: N/A;   HERNIA REPAIR  1960, 1974   inguinal bilateral   HYDROCELE EXCISION Right 1984   INGUINAL HERNIA REPAIR Bilateral 1960 & 1974   INSERTION OF MESH  11/07/2011   Procedure: INSERTION OF MESH;  Surgeon: Cherylynn Ridges, MD;  Location: [facility];  Service: General;  Laterality: N/A;   LAPAROSCOPIC SIGMOID COLECTOMY  11-12-2010   diverticulitis   NASAL SEPTOPLASTY W/ TURBINOPLASTY Bilateral 09/11/2018   Procedure: NASAL SEPTOPLASTY WITH BILATERAL TURBINATE REDUCTION;  Surgeon: Newman Pies, MD;  Location: [facility]  [place];  Service: ENT;  Laterality: Bilateral;   NASAL SEPTUM SURGERY  1982   PENILE BIOPSY N/A 12/26/2014   Procedure: PENILE  SKIN BIOPSY WITH PENILE BLOCK;  Surgeon: Sebastian Ache, MD;  Location: [facility];  Service: Urology;  Laterality: N/A;   PILONIDAL CYST EXCISION  1973   TONSILLECTOMY  1958   VENTRAL HERNIA REPAIR  11/07/2011   Procedure: LAPAROSCOPIC VENTRAL HERNIA;  Surgeon: Cherylynn Ridges, MD;  Location: [facility] OR;  Service: General;  Laterality: N/A;   Social History:  reports that he has never smoked. He has never used smokeless tobacco. He reports current alcohol use of about 7.0 - 14.0 standard drinks of alcohol per week. He reports that he does not use drugs.  Allergies  Allergen Reactions   Atorvastatin Other (See Comments)    Muscle pain    Family History  Problem Relation Age of Onset   Cancer Mother        breast   Thyroid disease Mother    Asthma Mother    Cancer Father        prostate   Dementia Father    Heart disease Father    Cancer Maternal Uncle        prostate   Hypertension Brother    Arthritis Maternal Grandmother     Prior to Admission medications   Medication Sig Start Date End Date Taking? Authorizing Provider  Acetaminophen (TYLENOL) 325  MG CAPS Take 2 capsules by mouth daily as needed.   Yes [provider]  aspirin EC 81 MG tablet Take 81 mg by mouth at bedtime. Swallow whole.   Yes [provider]  bismuth subsalicylate (PEPTO BISMOL) 262 MG/15ML suspension Take 30 mLs by mouth every 6 (six) hours as needed for indigestion or diarrhea or loose stools.   Yes [provider]  ezetimibe (ZETIA) 10 MG tablet Take 10 mg by mouth daily.   Yes [provider]  fenofibrate (TRICOR) 145 MG tablet Take 145 mg by mouth daily.   Yes [provider]  levothyroxine (SYNTHROID, LEVOTHROID) 50 MCG tablet Take 50-75 mcg by mouth daily before breakfast. 1.5 tablet on Monday, Wednesday  and Friday; 1 tablet all other days of the week   Yes [provider]  magnesium gluconate (MAGONATE) 500 MG tablet Take 1,000 mg by mouth at bedtime.   Yes [provider]  Melatonin 10 MG CAPS Take 10 mg by mouth daily as needed (sleep).   Yes [provider]  Multiple Vitamins-Minerals (PRESERVISION AREDS 2 PO) Take 1 capsule by mouth 2 (two) times daily.   Yes [provider]  rosuvastatin (CRESTOR) 20 MG tablet TAKE ONE TABLET BY MOUTH DAILY Patient taking differently: Take 20 mg by mouth daily. 04/02/21  Yes Corky Crafts, MD  sodium chloride (MURO 128) 2 % ophthalmic solution Place 1 drop into both eyes every 4 (four) hours as needed for eye irritation.   Yes [provider]  Zinc Sulfate (ZINC 15 PO) Take 1 tablet by mouth 2 (two) times daily.   Yes [provider]    Physical Exam: Vitals:   12/16/22 2146 12/16/22 2153 12/16/22 2312 12/17/22 0409  BP: (!) 78/49  (!) 165/81 (!) 166/75  Pulse: 67  (!) 58 63  Resp: 20  16 18   Temp:  98.1 F (36.7 C) (!) 97.5 F (36.4 C) 97.8 F (36.6 C)  TempSrc:  Oral Oral Oral  SpO2: 100%  100% 100%  Weight:      Height:       Constitutional: NAD, calm, comfortable Eyes: PERRL, lids and conjunctivae normal ENMT: Mucous membranes are moist. Posterior pharynx clear of any exudate or lesions.Normal dentition.  Neck: normal, supple, no masses, no thyromegaly Respiratory: clear to auscultation bilaterally, no wheezing, no crackles. Normal respiratory effort. No accessory muscle use.  Cardiovascular: Regular rate and rhythm, no murmurs / rubs / gallops. No extremity edema. 2+ pedal pulses. No carotid bruits.  Abdomen: no tenderness, no masses palpated. No hepatosplenomegaly. Bowel sounds positive.  Musculoskeletal: no clubbing / cyanosis. No joint deformity upper and lower extremities. Good ROM, no contractures. Normal muscle tone.  Skin: no rashes, lesions, ulcers. No  induration Neurologic: CN 2-12 grossly intact. Sensation intact, DTR normal. Strength 5/5 in all 4.  Psychiatric: Normal judgment and insight. Alert and oriented x 3. Normal mood.   Data Reviewed:    Labs on Admission: I have personally reviewed following labs and imaging studies  CBC: Recent Labs  Lab 12/10/22 1157 12/16/22 1805  WBC 6.5 7.1  NEUTROABS 3.8  --   HGB 13.1 13.6  HCT 39.1 42.0  MCV 85.6 87.1  PLT 190 247   Basic Metabolic Panel: Recent Labs  Lab 12/10/22 1157 12/16/22 1805  NA 139 140  K 4.0 4.7  CL 106 107  CO2 23 22  GLUCOSE 116* 108*  BUN 16 16  CREATININE 1.06 1.03  CALCIUM 9.3 9.7  GFR: Estimated Creatinine Clearance: 73.6 mL/min (by C-G formula based on SCr of 1.03 mg/dL). Liver Function Tests: Recent Labs  Lab 12/10/22 1157 12/16/22 1805  AST 18 26  ALT 15 16  ALKPHOS 33* 37*  BILITOT 0.3 0.5  PROT 6.8 6.7  ALBUMIN 3.6 3.7   Recent Labs  Lab 12/10/22 1157  LIPASE 33   No results for input(s): "AMMONIA" in the last 168 hours. Coagulation Profile: No results for input(s): "INR", "PROTIME" in the last 168 hours. Cardiac Enzymes: No results for input(s): "CKTOTAL", "CKMB", "CKMBINDEX", "TROPONINI" in the last 168 hours. BNP (last 3 results) No results for input(s): "PROBNP" in the last 8760 hours. HbA1C: No results for input(s): "HGBA1C" in the last 72 hours. CBG: No results for input(s): "GLUCAP" in the last 168 hours. Lipid Profile: No results for input(s): "CHOL", "HDL", "LDLCALC", "TRIG", "CHOLHDL", "LDLDIRECT" in the last 72 hours. Thyroid Function Tests: No results for input(s): "TSH", "T4TOTAL", "FREET4", "T3FREE", "THYROIDAB" in the last 72 hours. Anemia Panel: No results for input(s): "VITAMINB12", "FOLATE", "FERRITIN", "TIBC", "IRON", "RETICCTPCT" in the last 72 hours. Urine analysis:    Component Value Date/Time   COLORURINE STRAW (A) 12/10/2022 1408   APPEARANCEUR CLEAR 12/10/2022 1408   LABSPEC 1.008  12/10/2022 1408   PHURINE 7.0 12/10/2022 1408   GLUCOSEU NEGATIVE 12/10/2022 1408   HGBUR SMALL (A) 12/10/2022 1408   BILIRUBINUR NEGATIVE 12/10/2022 1408   BILIRUBINUR negative 12/03/2014 1852   KETONESUR NEGATIVE 12/10/2022 1408   PROTEINUR NEGATIVE 12/10/2022 1408   UROBILINOGEN 0.2 12/03/2014 1852   NITRITE NEGATIVE 12/10/2022 1408   LEUKOCYTESUR NEGATIVE 12/10/2022 1408    Radiological Exams on Admission: CT Head Wo Contrast  Result Date: 12/16/2022 CLINICAL DATA:  Fall EXAM: CT HEAD WITHOUT CONTRAST CT CERVICAL SPINE WITHOUT CONTRAST TECHNIQUE: Multidetector CT imaging of the head and cervical spine was performed following the standard protocol without intravenous contrast. Multiplanar CT image reconstructions of the cervical spine were also generated. RADIATION DOSE REDUCTION: This exam was performed according to the departmental dose-optimization program which includes automated exposure control, adjustment of the mA and/or kV according to patient size and/or use of iterative reconstruction technique. COMPARISON:  None Available. FINDINGS: CT HEAD FINDINGS Brain: There is no mass, hemorrhage or extra-axial collection. There is generalized atrophy without lobar predilection. The brain parenchyma is normal, without evidence of acute or chronic infarction. Vascular: No abnormal hyperdensity of the major intracranial arteries or dural venous sinuses. No intracranial atherosclerosis. Skull: The visualized skull base, calvarium and extracranial soft tissues are normal. Sinuses/Orbits: No fluid levels or advanced mucosal thickening of the visualized paranasal sinuses. No mastoid or middle ear effusion. The orbits are normal. CT CERVICAL SPINE FINDINGS Alignment: No static subluxation. Facets are aligned. Occipital condyles are normally positioned. Skull base and vertebrae: No acute fracture. Soft tissues and spinal canal: No prevertebral fluid or swelling. No visible canal hematoma. Disc levels: No  advanced spinal canal or neural foraminal stenosis. Upper chest: No pneumothorax, pulmonary nodule or pleural effusion. Other: Normal visualized paraspinal cervical soft tissues. IMPRESSION: 1. No acute intracranial abnormality. 2. No acute fracture or static subluxation of the cervical spine. Electronically Signed   By: Deatra Robinson M.D.   On: 12/16/2022 22:17   CT Cervical Spine Wo Contrast  Result Date: 12/16/2022 CLINICAL DATA:  Fall EXAM: CT HEAD WITHOUT CONTRAST CT CERVICAL SPINE WITHOUT CONTRAST TECHNIQUE: Multidetector CT imaging of the head and cervical spine was performed following the standard protocol without intravenous contrast. Multiplanar CT image reconstructions  of the cervical spine were also generated. RADIATION DOSE REDUCTION: This exam was performed according to the departmental dose-optimization program which includes automated exposure control, adjustment of the mA and/or kV according to patient size and/or use of iterative reconstruction technique. COMPARISON:  None Available. FINDINGS: CT HEAD FINDINGS Brain: There is no mass, hemorrhage or extra-axial collection. There is generalized atrophy without lobar predilection. The brain parenchyma is normal, without evidence of acute or chronic infarction. Vascular: No abnormal hyperdensity of the major intracranial arteries or dural venous sinuses. No intracranial atherosclerosis. Skull: The visualized skull base, calvarium and extracranial soft tissues are normal. Sinuses/Orbits: No fluid levels or advanced mucosal thickening of the visualized paranasal sinuses. No mastoid or middle ear effusion. The orbits are normal. CT CERVICAL SPINE FINDINGS Alignment: No static subluxation. Facets are aligned. Occipital condyles are normally positioned. Skull base and vertebrae: No acute fracture. Soft tissues and spinal canal: No prevertebral fluid or swelling. No visible canal hematoma. Disc levels: No advanced spinal canal or neural foraminal  stenosis. Upper chest: No pneumothorax, pulmonary nodule or pleural effusion. Other: Normal visualized paraspinal cervical soft tissues. IMPRESSION: 1. No acute intracranial abnormality. 2. No acute fracture or static subluxation of the cervical spine. Electronically Signed   By: Deatra Robinson M.D.   On: 12/16/2022 22:17    EKG: Independently reviewed.   Assessment and Plan: * GI bleed Pt with GIB with maroon stool. ? Diverticular bleed given recent diverticulitis this past week?  Or could be UGIB. Repeat CBC in AM Type and screen Clear liquid diet Message sent to Dr. Ewing Schlein If bleeding persists / worsens: would obtain CTA GIB study.  DIVERTICULITIS-COLON Pt with diverticulitis diagnosed last week, now once again has diverticulitis following a prior partial colectomy for diverticulitis in past. Has taken ~5 days of ABx thus far he estimates with 1-2 days left. Abd pain improved some but now having GIB. Will continue flagyl Will hold cipro while in hospital and just use rocephin instead.  Paroxysmal atrial fibrillation (HCC) On chart, but dont see that pt on any rate control meds, nor AC (just as well that he isnt on [facility] since he's having a GIB today).      Advance Care Planning:   Code Status: Full Code  Consults: Dr. Ewing Schlein  Family Communication: No family in room  Severity of Illness: The appropriate patient status for this patient is OBSERVATION. Observation status is judged to be reasonable and necessary in order to provide the required intensity of service to ensure the patient's safety. The patient's presenting symptoms, physical exam findings, and initial radiographic and laboratory data in the context of their medical condition is felt to place them at decreased risk for further clinical deterioration. Furthermore, it is anticipated that the patient will be medically stable for discharge from the hospital within 2 midnights of admission.   Author: Hillary Bow.,  DO 12/17/2022 4:29 AM  For on call review [email or web address].

## 2022-12-17 NOTE — ED Notes (Signed)
Spoke to patient what he would like to do, as he was refusing lab work and needed IV for abx, States that it would be fine to do an IV but needed to use restroom first. Assisted pt to the [facility] in room. Let pt know if he needed to use restroom to let us know and push call bell as we weren't aware of his needs. Call bell within reach.

## 2022-12-18 DIAGNOSIS — K625 Hemorrhage of anus and rectum: Secondary | ICD-10-CM | POA: Diagnosis not present

## 2022-12-18 LAB — CBC
HCT: 35.8 % — ABNORMAL LOW (ref 39.0–52.0)
Hemoglobin: 11.7 g/dL — ABNORMAL LOW (ref 13.0–17.0)
MCH: 28.1 pg (ref 26.0–34.0)
MCHC: 32.7 g/dL (ref 30.0–36.0)
MCV: 85.9 fL (ref 80.0–100.0)
Platelets: 176 10*3/uL (ref 150–400)
RBC: 4.17 MIL/uL — ABNORMAL LOW (ref 4.22–5.81)
RDW: 14.7 % (ref 11.5–15.5)
WBC: 7.4 10*3/uL (ref 4.0–10.5)
nRBC: 0 % (ref 0.0–0.2)

## 2022-12-18 LAB — BASIC METABOLIC PANEL
Anion gap: 9 (ref 5–15)
BUN: 9 mg/dL (ref 8–23)
CO2: 24 mmol/L (ref 22–32)
Calcium: 8.8 mg/dL — ABNORMAL LOW (ref 8.9–10.3)
Chloride: 106 mmol/L (ref 98–111)
Creatinine, Ser: 0.82 mg/dL (ref 0.61–1.24)
GFR, Estimated: >60 mL/min (ref >60)
Glucose, Bld: 113 mg/dL — ABNORMAL HIGH (ref 70–99)
Potassium: 3.9 mmol/L (ref 3.5–5.1)
Sodium: 139 mmol/L (ref 135–145)

## 2022-12-18 MED ORDER — ASPIRIN EC 81 MG PO TBEC: 81.0000 mg | DELAYED_RELEASE_TABLET | Freq: Every day | ORAL | Status: AC

## 2022-12-18 MED ORDER — AMLODIPINE BESYLATE 5 MG PO TABS: 5.0000 mg | ORAL_TABLET | Freq: Every day | ORAL | 1 refills | Status: DC

## 2022-12-18 MED ORDER — AMLODIPINE BESYLATE 5 MG PO TABS: 5.0000 mg | ORAL_TABLET | Freq: Every day | ORAL | Status: DC

## 2022-12-18 NOTE — Progress Notes (Signed)
Eric Cordova 8:54 AM  Subjective: Patient doing much better no signs of bleeding although some loose brown stools and no pain tolerating clear liquids and no new complaints  Objective: Vital signs stable afebrile no acute distress abdomen is soft nontender chemistry is okay hemoglobin okay  Assessment: Improved GI symptoms  Plan: Will put on a soft diet and if no bleeding later today can go home either late afternoon or early evening and will follow-up with me as needed he will stop his antibiotics after tomorrow take his probiotics and let me know if diarrhea continues or increases  Leannah Guse E  office [phone] After 5PM or if no answer call [phone]

## 2022-12-18 NOTE — Discharge Summary (Signed)
[facility]  Physician Discharge Summary   Patient ID: Eric Cordova MRN: [id number] DOB/AGE: 1950/03/29 72 y.o.  Admit date: 12/16/2022 Discharge date: 12/18/2022    PCP: Alysia Penna, MD  DISCHARGE DIAGNOSES:  Diverticular bleed    DIVERTICULITIS-COLON   Paroxysmal atrial fibrillation (HCC)   RECOMMENDATIONS FOR OUTPATIENT FOLLOW UP: Outpatient follow-up with primary care provider in 1 week Patient told to hold aspirin for 1 week    Home Health: None Equipment/Devices: None  CODE STATUS: Full code  DISCHARGE CONDITION: fair  Diet recommendation: As before  INITIAL HISTORY: 72 y.o. male with medical history significant of diverticulitis s/p partial colectomy, HLD. Pt diagnosed with diverticulitis of colon last weekend on 11/30.  Started on cipro/flagyl of which he has taken maybe 5 doses.  Did have improvement in his symptoms but then subsequently started having bloody bowel movements.  Came back to the emergency department and was hospitalized for further management.   Consultants: [facility]   Procedures: None  HOSPITAL COURSE:   Hematochezia This is in the setting of recently diagnosed diverticulitis. Bleeding is most likely diverticular in origin as he does not have any abdominal pain currently. Recently done CT scan of the abdomen pelvis showed acute uncomplicated diverticulitis.  His abdomen is fairly benign on examination. Seen by gastroenterology.  Hemoglobin stabilized.  Did not require any transfusion.   Acute diverticulitis This was diagnosed on 12/1.  Patient was discharged on ciprofloxacin and Flagyl.  Changed over to ceftriaxone and Flagyl.  Abdomen is benign.  WBC is normal.  Has completed course of antibiotics.    Paroxysmal atrial fibrillation This occurred approximately 3 years ago.  Not on anticoagulation just on aspirin.  Aspirin currently on hold.  May resume in 1 week.   Hypothyroidism Continue with  levothyroxine.   Hyperlipidemia Continue with statin and Zetia.   Obesity Estimated body mass index is 35.16 kg/m as calculated from the following:   Height as of this encounter: 5\' 6"  (1.676 m).   Weight as of this encounter: 98.8 kg.  Patient is stable.  Okay for discharge home today.  PERTINENT LABS:  The results of significant diagnostics from this hospitalization (including imaging, microbiology, ancillary and laboratory) are listed below for reference.    Labs:   Basic Metabolic Panel: Recent Labs  Lab 12/16/22 1805 12/17/22 0521 12/18/22 0744  NA 140 136 139  K 4.7 4.0 3.9  CL 107 105 106  CO2 22 22 24   GLUCOSE 108* 148* 113*  BUN 16 18 9   CREATININE 1.03 1.04 0.82  CALCIUM 9.7 8.8* 8.8*   Liver Function Tests: Recent Labs  Lab 12/16/22 1805  AST 26  ALT 16  ALKPHOS 37*  BILITOT 0.5  PROT 6.7  ALBUMIN 3.7    CBC: Recent Labs  Lab 12/16/22 1805 12/17/22 0521 12/17/22 1327 12/18/22 0546  WBC 7.1 8.8 8.4 7.4  HGB 13.6 12.1* 10.9* 11.7*  HCT 42.0 38.7* 35.5* 35.8*  MCV 87.1 88.0 90.1 85.9  PLT 247 239 161 176    IMAGING STUDIES CT Head Wo Contrast  Result Date: 12/16/2022 CLINICAL DATA:  Fall EXAM: CT HEAD WITHOUT CONTRAST CT CERVICAL SPINE WITHOUT CONTRAST TECHNIQUE: Multidetector CT imaging of the head and cervical spine was performed following the standard protocol without intravenous contrast. Multiplanar CT image reconstructions of the cervical spine were also generated. RADIATION DOSE REDUCTION: This exam was performed according to the departmental dose-optimization program which includes automated exposure control, adjustment of the mA and/or kV according to  patient size and/or use of iterative reconstruction technique. COMPARISON:  None Available. FINDINGS: CT HEAD FINDINGS Brain: There is no mass, hemorrhage or extra-axial collection. There is generalized atrophy without lobar predilection. The brain parenchyma is normal, without evidence of  acute or chronic infarction. Vascular: No abnormal hyperdensity of the major intracranial arteries or dural venous sinuses. No intracranial atherosclerosis. Skull: The visualized skull base, calvarium and extracranial soft tissues are normal. Sinuses/Orbits: No fluid levels or advanced mucosal thickening of the visualized paranasal sinuses. No mastoid or middle ear effusion. The orbits are normal. CT CERVICAL SPINE FINDINGS Alignment: No static subluxation. Facets are aligned. Occipital condyles are normally positioned. Skull base and vertebrae: No acute fracture. Soft tissues and spinal canal: No prevertebral fluid or swelling. No visible canal hematoma. Disc levels: No advanced spinal canal or neural foraminal stenosis. Upper chest: No pneumothorax, pulmonary nodule or pleural effusion. Other: Normal visualized paraspinal cervical soft tissues. IMPRESSION: 1. No acute intracranial abnormality. 2. No acute fracture or static subluxation of the cervical spine. Electronically Signed   By: Deatra Robinson M.D.   On: 12/16/2022 22:17   CT Cervical Spine Wo Contrast  Result Date: 12/16/2022 CLINICAL DATA:  Fall EXAM: CT HEAD WITHOUT CONTRAST CT CERVICAL SPINE WITHOUT CONTRAST TECHNIQUE: Multidetector CT imaging of the head and cervical spine was performed following the standard protocol without intravenous contrast. Multiplanar CT image reconstructions of the cervical spine were also generated. RADIATION DOSE REDUCTION: This exam was performed according to the departmental dose-optimization program which includes automated exposure control, adjustment of the mA and/or kV according to patient size and/or use of iterative reconstruction technique. COMPARISON:  None Available. FINDINGS: CT HEAD FINDINGS Brain: There is no mass, hemorrhage or extra-axial collection. There is generalized atrophy without lobar predilection. The brain parenchyma is normal, without evidence of acute or chronic infarction. Vascular: No  abnormal hyperdensity of the major intracranial arteries or dural venous sinuses. No intracranial atherosclerosis. Skull: The visualized skull base, calvarium and extracranial soft tissues are normal. Sinuses/Orbits: No fluid levels or advanced mucosal thickening of the visualized paranasal sinuses. No mastoid or middle ear effusion. The orbits are normal. CT CERVICAL SPINE FINDINGS Alignment: No static subluxation. Facets are aligned. Occipital condyles are normally positioned. Skull base and vertebrae: No acute fracture. Soft tissues and spinal canal: No prevertebral fluid or swelling. No visible canal hematoma. Disc levels: No advanced spinal canal or neural foraminal stenosis. Upper chest: No pneumothorax, pulmonary nodule or pleural effusion. Other: Normal visualized paraspinal cervical soft tissues. IMPRESSION: 1. No acute intracranial abnormality. 2. No acute fracture or static subluxation of the cervical spine. Electronically Signed   By: Deatra Robinson M.D.   On: 12/16/2022 22:17   CT ABDOMEN PELVIS W CONTRAST  Result Date: 12/10/2022 CLINICAL DATA:  Two day history of abdominal pain, diarrhea, and indigestion EXAM: CT ABDOMEN AND PELVIS WITH CONTRAST TECHNIQUE: Multidetector CT imaging of the abdomen and pelvis was performed using the standard protocol following bolus administration of intravenous contrast. RADIATION DOSE REDUCTION: This exam was performed according to the departmental dose-optimization program which includes automated exposure control, adjustment of the mA and/or kV according to patient size and/or use of iterative reconstruction technique. CONTRAST:  OMNIPAQUE IOHEXOL 300 MG/ML  SOLN COMPARISON:  CT abdomen and pelvis dated 09/12/2019 FINDINGS: Lower chest: No focal consolidation or pulmonary nodule in the lung bases. No pleural effusion or pneumothorax demonstrated. Partially imaged heart size is normal. Coronary artery calcifications. Hepatobiliary: Scattered subcentimeter  hypodensities, too small to characterize.  No intra or extrahepatic biliary ductal dilation. Normal gallbladder. Pancreas: No focal lesions or main ductal dilation. Spleen: Normal in size without focal abnormality. Adrenals/Urinary Tract: No adrenal nodules. No suspicious renal mass, calculi or hydronephrosis. No focal bladder wall thickening. Stomach/Bowel: Normal appearance of the stomach. Postsurgical changes of the sigmoid colon with patent anastomosis. There is acute diverticulitis with pericolonic stranding immediately upstream of the anastomosis. Normal appendix. Vascular/Lymphatic: Aortic atherosclerosis. No enlarged abdominal or pelvic lymph nodes. Reproductive: Enlargement of the prostate with median lobe hypertrophy. Other: No free fluid, fluid collection, or free air. Musculoskeletal: No acute or abnormal lytic or blastic osseous lesions. IMPRESSION: 1. Acute uncomplicated diverticulitis of the sigmoid colon immediately upstream of the anastomosis. 2. Enlargement of the prostate with median lobe hypertrophy. 3. Aortic Atherosclerosis (ICD10-I70.0). Coronary artery calcifications. Assessment for potential risk factor modification, dietary therapy or pharmacologic therapy may be warranted, if clinically indicated. Electronically Signed   By: Agustin Cree M.D.   On: 12/10/2022 14:09    DISCHARGE EXAMINATION: Vitals:   12/17/22 1345 12/17/22 1603 12/17/22 2005 12/18/22 0558  BP: (!) 151/58 (!) 142/81 (!) 160/72 (!) 174/94  Pulse: 61 (!) 56 61 (!) 53  Resp:  18 18 18   Temp: 97.8 F (36.6 C) 98 F (36.7 C) 98.4 F (36.9 C) 97.9 F (36.6 C)  TempSrc: Oral Oral Oral Oral  SpO2: 99% 100% 99% 97%  Weight:      Height:       General appearance: Awake alert.  In no distress Resp: Clear to auscultation bilaterally.  Normal effort Cardio: S1-S2 is normal regular.  No S3-S4.  No rubs murmurs or bruit GI: Abdomen is soft.  Nontender nondistended.  Bowel sounds are present normal.  No masses  organomegaly   DISPOSITION: Home  Discharge Instructions     Call MD for:  difficulty breathing, headache or visual disturbances   Complete by: As directed    Call MD for:  extreme fatigue   Complete by: As directed    Call MD for:  persistant dizziness or light-headedness   Complete by: As directed    Call MD for:  persistant nausea and vomiting   Complete by: As directed    Call MD for:  severe uncontrolled pain   Complete by: As directed    Call MD for:  temperature >100.4   Complete by: As directed    Discharge instructions   Complete by: As directed    Your blood pressure was noted to be elevated so you have been started on an antihypertensive.  Follow-up with your primary care provider for further management.  Seek attention if bleeding recurs.  Eat a soft bland diet for the next few days and then you may resume your usual diet. Please hold your aspirin for now and resume on 12/26/2022 if you do not have recurrence of your bleeding.  You were cared for by a hospitalist during your hospital stay. If you have any questions about your discharge medications or the care you received while you were in the hospital after you are discharged, you can call the unit and asked to speak with the hospitalist on call if the hospitalist that took care of you is not available. Once you are discharged, your primary care physician will handle any further medical issues. Please note that NO REFILLS for any discharge medications will be authorized once you are discharged, as it is imperative that you return to your primary care physician (or establish a relationship with  a primary care physician if you do not have one) for your aftercare needs so that they can reassess your need for medications and monitor your lab values. If you do not have a primary care physician, you can call [phone] for a physician referral.   Increase activity slowly   Complete by: As directed          Allergies as of  12/18/2022       Reactions   Atorvastatin Other (See Comments)   Muscle pain        Medication List     TAKE these medications    amLODipine 5 MG tablet Commonly known as: NORVASC Take 1 tablet (5 mg total) by mouth daily.   aspirin EC 81 MG tablet Take 1 tablet (81 mg total) by mouth at bedtime. Swallow whole. Start taking on: December 26, 2022 What changed: These instructions start on December 26, 2022. If you are unsure what to do until then, ask your doctor or other care provider.   bismuth subsalicylate 262 MG/15ML suspension Commonly known as: PEPTO BISMOL Take 30 mLs by mouth every 6 (six) hours as needed for indigestion or diarrhea or loose stools.   ezetimibe 10 MG tablet Commonly known as: ZETIA Take 10 mg by mouth daily.   fenofibrate 145 MG tablet Commonly known as: TRICOR Take 145 mg by mouth daily.   levothyroxine 50 MCG tablet Commonly known as: SYNTHROID Take 50-75 mcg by mouth daily before breakfast. 1.5 tablet on Monday, Wednesday and Friday; 1 tablet all other days of the week   magnesium gluconate 500 MG tablet Commonly known as: MAGONATE Take 1,000 mg by mouth at bedtime.   Melatonin 10 MG Caps Take 10 mg by mouth daily as needed (sleep).   PRESERVISION AREDS 2 PO Take 1 capsule by mouth 2 (two) times daily.   rosuvastatin 20 MG tablet Commonly known as: CRESTOR TAKE ONE TABLET BY MOUTH DAILY   sodium chloride 2 % ophthalmic solution Commonly known as: MURO 128 Place 1 drop into both eyes every 4 (four) hours as needed for eye irritation.   Tylenol 325 MG Caps Generic drug: Acetaminophen Take 2 capsules by mouth daily as needed.   ZINC 15 PO Take 1 tablet by mouth 2 (two) times daily.          Follow-up Information     Vida Rigger, MD. Schedule an appointment as soon as possible for a visit .   Specialty: Gastroenterology Why: For recheck of your symptoms Contact information: [address] [place]  [address] [phone]         Alysia Penna, MD. Schedule an appointment as soon as possible for a visit in 1 week(s).   Specialty: Internal Medicine Why: post hospitalization follow up Contact information: [address] [place] [address] [phone]                 TOTAL DISCHARGE TIME: 35 minutes  Maitri Schnoebelen Rito Ehrlich  [facility] Pager on [email or web address]  12/19/2022, 10:38 AM

## 2022-12-18 NOTE — Progress Notes (Signed)
Mobility Specialist - Progress Note   12/18/22 1014  Mobility  Activity Ambulated with assistance in hallway  Level of Assistance Modified independent, requires aide device or extra time  Assistive Device None  Distance Ambulated (ft) 180 ft  Range of Motion/Exercises Active  Activity Response Tolerated well  Mobility Referral Yes  Mobility visit 1 Mobility  Mobility Specialist Start Time (ACUTE ONLY) 0944  Mobility Specialist Stop Time (ACUTE ONLY) 0951  Mobility Specialist Time Calculation (min) (ACUTE ONLY) 7 min   Received in bed and agreed to mobility. Had no issues throughout session and returned to chair with all needs met.  Marilynne Halsted Mobility Specialist

## 2022-12-18 NOTE — Plan of Care (Signed)

## 2023-01-01 ENCOUNTER — Ambulatory Visit
Admission: EM | Admit: 2023-01-01 | Discharge: 2023-01-01 | Disposition: A | Discharge status: Home / Self Care | Payer: Medicare Other | Attending: Emergency Medicine | Admitting: Emergency Medicine

## 2023-01-01 DIAGNOSIS — U071 COVID-19: Secondary | ICD-10-CM | POA: Diagnosis not present

## 2023-01-01 MED ORDER — PREDNISONE 10 MG (21) PO TBPK: ORAL_TABLET | Freq: Every day | ORAL | 0 refills | Status: DC

## 2023-01-01 MED ORDER — IPRATROPIUM BROMIDE 0.03 % NA SOLN: 2.0000 | Freq: Two times a day (BID) | NASAL | 0 refills | Status: DC

## 2023-01-01 MED ORDER — AMOXICILLIN-POT CLAVULANATE 875-125 MG PO TABS: 1.0000 | ORAL_TABLET | Freq: Two times a day (BID) | ORAL | 0 refills | Status: DC

## 2023-01-01 NOTE — ED Provider Notes (Signed)
Eric Cordova    CSN: [id number] Arrival date & time: 01/01/23  1031      History   Chief Complaint No chief complaint on file.   HPI Eric Cordova is a 72 y.o. male.   patient presents for evaluation of nasal congestion, rhinorrhea and sinus pressure along the nose bridge present for 10 days.  Positive for COVID-19, has completed Paxlovid,.  Denies fever, shortness of breath wheezing, ear or throat pain.  Tolerating food and liquids.  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    w/ irritation   Pneumonia     Patient Active Problem List   Diagnosis Date Noted   GI bleed 12/17/2022   Rectal bleed 12/17/2022   Sciatica 07/21/2022   Ingrown toenail 10/18/2021   Paroxysmal atrial fibrillation (HCC) 05/25/2020   Screening for malignant neoplasm of colon 05/25/2020   Urgency of urination 05/25/2020   Body mass index (BMI) 34.0-34.9, adult 05/25/2020   History of colon polyps 08/15/2018   Seborrheic dermatitis 02/14/2018   Hyperlipidemia 08/03/2017   Acquired hypothyroidism 08/03/2017   Allergy 08/03/2017   Degenerative joint disease 08/03/2017   Asthma 08/03/2017   Hypertension 08/03/2017   Prediabetes 08/03/2017   Primary insomnia 08/03/2017   Lichen sclerosus-associated penile intraepithelial neoplasia 02/16/2015   Vitamin D deficiency 12/03/2012   Incisional hernia 08/16/2011   Postop check 11/30/2010   Diverticular disease of colon 10/19/2010   ABDOMINAL PAIN, GENERALIZED 03/04/2009   DIARRHEA-PRESUMED INFECTIOUS 02/19/2009   DIVERTICULITIS-COLON 02/19/2009   ABNORMAL FINDINGS GI TRACT 02/19/2009    Past Surgical History:  Procedure Laterality Date   COLON SURGERY     CYSTOSCOPY N/A 12/26/2014   Procedure: CYSTOSCOPY;  Surgeon: Sebastian Ache, MD;   Location: [facility];  Service: Urology;  Laterality: N/A;   HERNIA REPAIR  1960, 1974   inguinal bilateral   HYDROCELE EXCISION Right 1984   INGUINAL HERNIA REPAIR Bilateral 1960 & 1974   INSERTION OF MESH  11/07/2011   Procedure: INSERTION OF MESH;  Surgeon: Cherylynn Ridges, MD;  Location: [facility];  Service: General;  Laterality: N/A;   LAPAROSCOPIC SIGMOID COLECTOMY  11-12-2010   diverticulitis   NASAL SEPTOPLASTY W/ TURBINOPLASTY Bilateral 09/11/2018   Procedure: NASAL SEPTOPLASTY WITH BILATERAL TURBINATE REDUCTION;  Surgeon: Newman Pies, MD;  Location: [facility];  Service: ENT;  Laterality: Bilateral;   NASAL SEPTUM SURGERY  1982   PENILE BIOPSY N/A 12/26/2014   Procedure: PENILE  SKIN BIOPSY WITH PENILE BLOCK;  Surgeon: Sebastian Ache, MD;  Location: [facility];  Service: Urology;  Laterality: N/A;   PILONIDAL CYST EXCISION  1973   TONSILLECTOMY  1958   VENTRAL HERNIA REPAIR  11/07/2011   Procedure: LAPAROSCOPIC VENTRAL HERNIA;  Surgeon: Cherylynn Ridges, MD;  Location: [facility];  Service: General;  Laterality: N/A;       Home Medications    Prior to Admission medications   Medication Sig Start Date End Date Taking? Authorizing Provider  amoxicillin-clavulanate (AUGMENTIN) 875-125 MG tablet Take 1 tablet by mouth every 12 (twelve) hours. 01/06/23  Yes Lorris Carducci R, NP  ipratropium (ATROVENT) 0.03 % nasal spray Place 2 sprays into both nostrils every 12 (twelve) hours. 01/01/23  Yes Salli Quarry  R, NP  predniSONE (STERAPRED UNI-PAK 21 TAB) 10 MG (21) TBPK tablet Take by mouth daily. Take 6 tabs by mouth daily  for 1 days, then 5 tabs for 1 days, then 4 tabs for 1 days, then 3 tabs for 1 days, 2 tabs for 1 days, then 1 tab by mouth daily for 1 days 01/01/23  Yes Kolston Lacount R, NP  Acetaminophen (TYLENOL) 325 MG CAPS Take 2 capsules by mouth daily as needed.    [provider]  amLODipine (NORVASC) 5 MG tablet Take 1 tablet (5 mg  total) by mouth daily. 12/18/22   Osvaldo Shipper, MD  aspirin EC 81 MG tablet Take 1 tablet (81 mg total) by mouth at bedtime. Swallow whole. 12/26/22   Osvaldo Shipper, MD  bismuth subsalicylate (PEPTO BISMOL) 262 MG/15ML suspension Take 30 mLs by mouth every 6 (six) hours as needed for indigestion or diarrhea or loose stools.    [provider]  ezetimibe (ZETIA) 10 MG tablet Take 10 mg by mouth daily.    [provider]  fenofibrate (TRICOR) 145 MG tablet Take 145 mg by mouth daily.    [provider]  levothyroxine (SYNTHROID, LEVOTHROID) 50 MCG tablet Take 50-75 mcg by mouth daily before breakfast. 1.5 tablet on Monday, Wednesday and Friday; 1 tablet all other days of the week    [provider]  magnesium gluconate (MAGONATE) 500 MG tablet Take 1,000 mg by mouth at bedtime.    [provider]  Melatonin 10 MG CAPS Take 10 mg by mouth daily as needed (sleep).    [provider]  Multiple Vitamins-Minerals (PRESERVISION AREDS 2 PO) Take 1 capsule by mouth 2 (two) times daily.    [provider]  rosuvastatin (CRESTOR) 20 MG tablet TAKE ONE TABLET BY MOUTH DAILY Patient taking differently: Take 20 mg by mouth daily. 04/02/21   Corky Crafts, MD  sodium chloride (MURO 128) 2 % ophthalmic solution Place 1 drop into both eyes every 4 (four) hours as needed for eye irritation.    [provider]  Zinc Sulfate (ZINC 15 PO) Take 1 tablet by mouth 2 (two) times daily.    [provider]    Family History Family History  Problem Relation Age of Onset   Cancer Mother        breast   Thyroid disease Mother    Asthma Mother    Cancer Father        prostate   Dementia Father    Heart disease Father    Cancer Maternal Uncle        prostate   Hypertension Brother    Arthritis Maternal Grandmother     Social History Social History   Tobacco Use   Smoking status: Never   Smokeless tobacco: Never  Substance  Use Topics   Alcohol use: Yes    Alcohol/week: 7.0 - 14.0 standard drinks of alcohol    Types: 7 - 14 Shots of liquor per week    Comment: 1 to 2  shots daily   Drug use: No     Allergies   Atorvastatin   Review of Systems Review of Systems   Physical Exam Triage Vital Signs ED Triage Vitals  Encounter Vitals Group     BP      Systolic BP Percentile      Diastolic BP Percentile      Pulse      Resp      Temp  Temp src      SpO2      Weight      Height      Head Circumference      Peak Flow      Pain Score      Pain Loc      Pain Education      Exclude from Growth Chart    No data found.  Updated Vital Signs There were no vitals taken for this visit.  Visual Acuity Right Eye Distance:   Left Eye Distance:   Bilateral Distance:    Right Eye Near:   Left Eye Near:    Bilateral Near:     Physical Exam Constitutional:      Appearance: Normal appearance.  HENT:     Head: Normocephalic.     Right Ear: Tympanic membrane, ear canal and external ear normal.     Left Ear: Tympanic membrane, ear canal and external ear normal.     Nose: Congestion present. No rhinorrhea.     Right Sinus: No maxillary sinus tenderness or frontal sinus tenderness.     Left Sinus: No maxillary sinus tenderness or frontal sinus tenderness.     Mouth/Throat:     Mouth: Mucous membranes are moist.     Pharynx: Oropharynx is clear.  Eyes:     Extraocular Movements: Extraocular movements intact.  Cardiovascular:     Rate and Rhythm: Normal rate and regular rhythm.     Pulses: Normal pulses.     Heart sounds: Normal heart sounds.  Pulmonary:     Effort: Pulmonary effort is normal.     Breath sounds: Normal breath sounds.  Musculoskeletal:     Cervical back: Normal range of motion and neck supple.  Neurological:     Mental Status: He is alert and oriented to person, place, and time. Mental status is at baseline.      UC Treatments / Results  Labs (all labs ordered are  listed, but only abnormal results are displayed) Labs Reviewed - No data to display  EKG   Radiology No results found.  Procedures Procedures (including critical care time)  Medications Ordered in UC Medications - No data to display  Initial Impression / Assessment and Plan / UC Course  I have reviewed the triage vital signs and the nursing notes.  Pertinent labs & imaging results that were available during my care of the patient were reviewed by me and considered in my medical decision making (see chart for details).  Covid 19  Patient is in no signs of distress nor toxic appearing.  Vital signs are stable.  Low suspicion for pneumonia, pneumothorax or bronchitis and therefore will defer imaging.  Prescribed prednisone and Atrovent nasal spray, watch wait antibiotic placed at pharmacy if still no improvement with symptoms for an additional week, Augmentin sent to pharmacy.May use additional over-the-counter medications as needed for supportive care.  May follow-up with urgent care as needed if symptoms persist or worsen.  Final Clinical Impressions(s) / UC Diagnoses   Final diagnoses:  COVID-19     Discharge Instructions      Your symptoms today are most likely being caused the COVID-19 virus should steadily improve with time  Begin prednisone every morning with food as directed to help reduce sinus pressure  Use nasal spray every morning and every evening for further reduction of congestion, would recommend taking over-the-counter pseudoephedrine  After completion of supportive measures if symptoms do not improve you may begin Augmentin which  we will be available at the pharmacy on Friday, December 27    You can take Tylenol and/or Ibuprofen as needed for fever reduction and pain relief.   For cough: honey 1/2 to 1 teaspoon (you can dilute the honey in water or another fluid).  You can also use guaifenesin and dextromethorphan for cough. You can use a humidifier for  chest congestion and cough.  If you don't have a humidifier, you can sit in the bathroom with the hot shower running.      For sore throat: try warm salt water gargles, cepacol lozenges, throat spray, warm tea or water with lemon/honey, popsicles or ice, or OTC cold relief medicine for throat discomfort.   For congestion: take a daily anti-histamine like Zyrtec, Claritin, and a oral decongestant, such as pseudoephedrine.  You can also use Flonase 1-2 sprays in each nostril daily.   It is important to stay hydrated: drink plenty of fluids (water, gatorade/powerade/pedialyte, juices, or teas) to keep your throat moisturized and help further relieve irritation/discomfort.    ED Prescriptions     Medication Sig Dispense Auth. Provider   predniSONE (STERAPRED UNI-PAK 21 TAB) 10 MG (21) TBPK tablet Take by mouth daily. Take 6 tabs by mouth daily  for 1 days, then 5 tabs for 1 days, then 4 tabs for 1 days, then 3 tabs for 1 days, 2 tabs for 1 days, then 1 tab by mouth daily for 1 days 21 tablet Gaddiel Cullens R, NP   ipratropium (ATROVENT) 0.03 % nasal spray Place 2 sprays into both nostrils every 12 (twelve) hours. 30 mL Kayl Stogdill R, NP   amoxicillin-clavulanate (AUGMENTIN) 875-125 MG tablet Take 1 tablet by mouth every 12 (twelve) hours. 14 tablet Elsi Stelzer, Elita Boone, NP      PDMP not reviewed this encounter.   Valinda Hoar, NP 01/01/23 1323

## 2023-01-01 NOTE — ED Triage Notes (Signed)
Triaged by provider  

## 2023-01-01 NOTE — Discharge Instructions (Signed)
Your symptoms today are most likely being caused the COVID-19 virus should steadily improve with time  Begin prednisone every morning with food as directed to help reduce sinus pressure  Use nasal spray every morning and every evening for further reduction of congestion, would recommend taking over-the-counter pseudoephedrine  After completion of supportive measures if symptoms do not improve you may begin Augmentin which we will be available at the pharmacy on Friday, December 27    You can take Tylenol and/or Ibuprofen as needed for fever reduction and pain relief.   For cough: honey 1/2 to 1 teaspoon (you can dilute the honey in water or another fluid).  You can also use guaifenesin and dextromethorphan for cough. You can use a humidifier for chest congestion and cough.  If you don't have a humidifier, you can sit in the bathroom with the hot shower running.      For sore throat: try warm salt water gargles, cepacol lozenges, throat spray, warm tea or water with lemon/honey, popsicles or ice, or OTC cold relief medicine for throat discomfort.   For congestion: take a daily anti-histamine like Zyrtec, Claritin, and a oral decongestant, such as pseudoephedrine.  You can also use Flonase 1-2 sprays in each nostril daily.   It is important to stay hydrated: drink plenty of fluids (water, gatorade/powerade/pedialyte, juices, or teas) to keep your throat moisturized and help further relieve irritation/discomfort.

## 2023-01-16 ENCOUNTER — Ambulatory Visit (HOSPITAL_COMMUNITY): Payer: Medicare Other

## 2023-02-02 DIAGNOSIS — K5791 Diverticulosis of intestine, part unspecified, without perforation or abscess with bleeding: Secondary | ICD-10-CM | POA: Diagnosis not present

## 2023-02-02 DIAGNOSIS — K5730 Diverticulosis of large intestine without perforation or abscess without bleeding: Secondary | ICD-10-CM | POA: Diagnosis not present

## 2023-02-02 DIAGNOSIS — D62 Acute posthemorrhagic anemia: Secondary | ICD-10-CM | POA: Diagnosis not present

## 2023-02-07 ENCOUNTER — Ambulatory Visit (HOSPITAL_COMMUNITY): Payer: Medicare Other | Attending: Cardiology

## 2023-02-07 DIAGNOSIS — R9431 Abnormal electrocardiogram [ECG] [EKG]: Secondary | ICD-10-CM | POA: Insufficient documentation

## 2023-02-07 LAB — ECHOCARDIOGRAM COMPLETE
AV Vena cont: 0.27 cm
Area-P 1/2: 2.93 cm2
MV M vel: 5.12 m/s
MV Peak grad: 104.9 mm[Hg]
S' Lateral: 2.16 cm

## 2023-04-20 ENCOUNTER — Encounter (HOSPITAL_BASED_OUTPATIENT_CLINIC_OR_DEPARTMENT_OTHER): Payer: Self-pay

## 2023-04-20 ENCOUNTER — Ambulatory Visit (INDEPENDENT_AMBULATORY_CARE_PROVIDER_SITE_OTHER): Admitting: Family Medicine

## 2023-04-20 ENCOUNTER — Encounter (HOSPITAL_BASED_OUTPATIENT_CLINIC_OR_DEPARTMENT_OTHER): Payer: Self-pay | Admitting: Family Medicine

## 2023-04-20 VITALS — BP 157/77 | HR 52 | Ht 66.0 in | Wt 210.0 lb

## 2023-04-20 DIAGNOSIS — R112 Nausea with vomiting, unspecified: Secondary | ICD-10-CM | POA: Insufficient documentation

## 2023-04-20 DIAGNOSIS — D649 Anemia, unspecified: Secondary | ICD-10-CM

## 2023-04-20 DIAGNOSIS — I10 Essential (primary) hypertension: Secondary | ICD-10-CM

## 2023-04-20 DIAGNOSIS — L219 Seborrheic dermatitis, unspecified: Secondary | ICD-10-CM

## 2023-04-20 DIAGNOSIS — R1032 Left lower quadrant pain: Secondary | ICD-10-CM | POA: Insufficient documentation

## 2023-04-20 DIAGNOSIS — E785 Hyperlipidemia, unspecified: Secondary | ICD-10-CM

## 2023-04-20 DIAGNOSIS — E039 Hypothyroidism, unspecified: Secondary | ICD-10-CM | POA: Diagnosis not present

## 2023-04-20 DIAGNOSIS — K5732 Diverticulitis of large intestine without perforation or abscess without bleeding: Secondary | ICD-10-CM | POA: Insufficient documentation

## 2023-04-20 DIAGNOSIS — R143 Flatulence: Secondary | ICD-10-CM | POA: Insufficient documentation

## 2023-04-20 DIAGNOSIS — R194 Change in bowel habit: Secondary | ICD-10-CM | POA: Insufficient documentation

## 2023-04-20 DIAGNOSIS — Z0000 Encounter for general adult medical examination without abnormal findings: Secondary | ICD-10-CM

## 2023-04-20 DIAGNOSIS — R1033 Periumbilical pain: Secondary | ICD-10-CM | POA: Insufficient documentation

## 2023-04-20 DIAGNOSIS — R197 Diarrhea, unspecified: Secondary | ICD-10-CM | POA: Insufficient documentation

## 2023-04-20 DIAGNOSIS — D62 Acute posthemorrhagic anemia: Secondary | ICD-10-CM | POA: Insufficient documentation

## 2023-04-20 HISTORY — DX: Nausea with vomiting, unspecified: R11.2

## 2023-04-20 LAB — CBC WITH DIFFERENTIAL/PLATELET
Basophils Absolute: 0.1 10*3/uL (ref 0.0–0.2)
Basos: 1 %
EOS (ABSOLUTE): 0.5 10*3/uL — ABNORMAL HIGH (ref 0.0–0.4)
Eos: 6 %
Hematocrit: 43.3 % (ref 37.5–51.0)
Hemoglobin: 13.9 g/dL (ref 13.0–17.7)
Immature Grans (Abs): 0 10*3/uL (ref 0.0–0.1)
Immature Granulocytes: 0 %
Lymphocytes Absolute: 2 10*3/uL (ref 0.7–3.1)
Lymphs: 26 %
MCH: 27.2 pg (ref 26.6–33.0)
MCHC: 32.1 g/dL (ref 31.5–35.7)
MCV: 85 fL (ref 79–97)
Monocytes Absolute: 0.8 10*3/uL (ref 0.1–0.9)
Monocytes: 11 %
Neutrophils Absolute: 4.2 10*3/uL (ref 1.4–7.0)
Neutrophils: 56 %
Platelets: 220 10*3/uL (ref 150–450)
RBC: 5.11 x10E6/uL (ref 4.14–5.80)
RDW: 14.5 % (ref 11.6–15.4)
WBC: 7.5 10*3/uL (ref 3.4–10.8)

## 2023-04-20 LAB — BASIC METABOLIC PANEL WITH GFR
BUN/Creatinine Ratio: 15 (ref 10–24)
BUN: 17 mg/dL (ref 8–27)
CO2: 25 mmol/L (ref 20–29)
Calcium: 10.2 mg/dL (ref 8.6–10.2)
Chloride: 105 mmol/L (ref 96–106)
Creatinine, Ser: 1.12 mg/dL (ref 0.76–1.27)
Glucose: 93 mg/dL (ref 70–99)
Potassium: 5 mmol/L (ref 3.5–5.2)
Sodium: 143 mmol/L (ref 134–144)
eGFR: 70 mL/min/{1.73_m2} (ref >59)

## 2023-04-20 MED ORDER — KETOCONAZOLE 2 % EX CREA: 1.0000 | TOPICAL_CREAM | Freq: Two times a day (BID) | CUTANEOUS | 3 refills | Status: AC

## 2023-04-20 NOTE — Progress Notes (Signed)
 New Patient Office Visit  Subjective   Patient ID: Eric Cordova, male    DOB: [birth year]  Age: 73 y.o. MRN: [id number]  CC:  Chief Complaint  Patient presents with   Establish Care    Establish Care -seen prior PCP at [facility]     HPI Eric Cordova presents to establish care Last PCP - Dr. Link Snuffer  Had hospitalization in December due to GI bleeding. Had difficulty with getting in to see PCP in timely manner following this. Has followed up with Dr. Ewing Schlein related to this in January.  Has noticed some flaking, redness over aspects of face and along hairline. Has tried topical medication, uncertain of name, sounds like triamcinolone. Has used topical moisturizer, minimal improvement.  Hypothyroidism: Patient currently taking levothyroxine - 1-1.5 on various days.  Reports a prior TSH has been well-controlled.  HLD: taking rosuvastatin, fenofibrate, ezetimibe. No issues reported.  Patient is originally from [place], [place]. Has lived here since 34. Patient works as Aeronautical engineer. He enjoys watching sports, going out to dinner, watching movies. He did like to bowl, but difficulty now with numbness in his hands.  Outpatient Encounter Medications as of 04/20/2023  Medication Sig   Acetaminophen (TYLENOL) 325 MG CAPS Take 2 capsules by mouth daily as needed.   aspirin EC 81 MG tablet Take 1 tablet (81 mg total) by mouth at bedtime. Swallow whole.   ezetimibe (ZETIA) 10 MG tablet Take 10 mg by mouth daily.   fenofibrate (TRICOR) 145 MG tablet Take 145 mg by mouth daily.   ketoconazole (NIZORAL) 2 % cream Apply 1 Application topically 2 (two) times daily. To affected areas.   levothyroxine (SYNTHROID, LEVOTHROID) 50 MCG tablet Take 50-75 mcg by mouth daily before breakfast. 1.5 tablet on Monday, Wednesday and Friday; 1 tablet all other days of the week   magnesium gluconate (MAGONATE) 500 MG tablet Take 1,000 mg by mouth at bedtime.   Melatonin 10 MG CAPS Take 10 mg by  mouth daily as needed (sleep).   Multiple Vitamins-Minerals (PRESERVISION AREDS 2 PO) Take 1 capsule by mouth 2 (two) times daily.   rosuvastatin (CRESTOR) 20 MG tablet TAKE ONE TABLET BY MOUTH DAILY (Patient taking differently: Take 20 mg by mouth daily.)   Zinc Sulfate (ZINC 15 PO) Take 1 tablet by mouth 2 (two) times daily.   bismuth subsalicylate (PEPTO BISMOL) 262 MG/15ML suspension Take 30 mLs by mouth every 6 (six) hours as needed for indigestion or diarrhea or loose stools.   [DISCONTINUED] amLODipine (NORVASC) 5 MG tablet Take 1 tablet (5 mg total) by mouth daily.   [DISCONTINUED] amoxicillin-clavulanate (AUGMENTIN) 875-125 MG tablet Take 1 tablet by mouth every 12 (twelve) hours.   [DISCONTINUED] ipratropium (ATROVENT) 0.03 % nasal spray Place 2 sprays into both nostrils every 12 (twelve) hours.   [DISCONTINUED] predniSONE (STERAPRED UNI-PAK 21 TAB) 10 MG (21) TBPK tablet Take by mouth daily. Take 6 tabs by mouth daily  for 1 days, then 5 tabs for 1 days, then 4 tabs for 1 days, then 3 tabs for 1 days, 2 tabs for 1 days, then 1 tab by mouth daily for 1 days   [DISCONTINUED] sodium chloride (MURO 128) 2 % ophthalmic solution Place 1 drop into both eyes every 4 (four) hours as needed for eye irritation.   No facility-administered encounter medications on file as of 04/20/2023.    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    w/ irritation   Pneumonia     Past Surgical History:  Procedure Laterality Date   COLON SURGERY     CYSTOSCOPY N/A 12/26/2014   Procedure: CYSTOSCOPY;  Surgeon: Sebastian Ache, MD;  Location: [facility];  Service: Urology;  Laterality: N/A;   HERNIA REPAIR  1960, 1974   inguinal bilateral   HYDROCELE EXCISION Right 1984    INGUINAL HERNIA REPAIR Bilateral 1960 & 1974   INSERTION OF MESH  11/07/2011   Procedure: INSERTION OF MESH;  Surgeon: Cherylynn Ridges, MD;  Location: [facility];  Service: General;  Laterality: N/A;   LAPAROSCOPIC SIGMOID COLECTOMY  11-12-2010   diverticulitis   NASAL SEPTOPLASTY W/ TURBINOPLASTY Bilateral 09/11/2018   Procedure: NASAL SEPTOPLASTY WITH BILATERAL TURBINATE REDUCTION;  Surgeon: Newman Pies, MD;  Location: [facility];  Service: ENT;  Laterality: Bilateral;   NASAL SEPTUM SURGERY  1982   PENILE BIOPSY N/A 12/26/2014   Procedure: PENILE  SKIN BIOPSY WITH PENILE BLOCK;  Surgeon: Sebastian Ache, MD;  Location: [facility];  Service: Urology;  Laterality: N/A;   PILONIDAL CYST EXCISION  1973   TONSILLECTOMY  1958   VENTRAL HERNIA REPAIR  11/07/2011   Procedure: LAPAROSCOPIC VENTRAL HERNIA;  Surgeon: Cherylynn Ridges, MD;  Location: [facility];  Service: General;  Laterality: N/A;    Family History  Problem Relation Age of Onset   Cancer Mother        breast   Thyroid disease Mother    Asthma Mother    Cancer Father        prostate   Dementia Father    Heart disease Father    Cancer Maternal Uncle        prostate   Hypertension Brother    Arthritis Maternal Grandmother     Social History   Socioeconomic History   Marital status: Married    Spouse name: Not on file   Number of children: Not on file   Years of education: Not on file   Highest education level: Not on file  Occupational History   Not on file  Tobacco Use   Smoking status: Never    Passive exposure: Never   Smokeless tobacco: Never  Vaping Use   Vaping status: Former  Substance and Sexual Activity   Alcohol use: Yes    Alcohol/week: 7.0 - 14.0 standard drinks of alcohol    Types: 7 - 14 Shots of liquor per week    Comment: 1 to 2  shots daily   Drug use: No   Sexual activity: Not on file  Other Topics Concern   Not on file  Social History Narrative   Not on file   Social  Drivers of Health   Financial Resource Strain: Low Risk  (08/15/2018)   Received from [facility], [facility]   Overall Financial Resource Strain (CARDIA)    Difficulty of Paying Living Expenses: Not hard at all  Food Insecurity: No Food Insecurity (12/17/2022)   Hunger Vital Sign    Worried About Running Out of Food in the Last Year: Never true    Ran Out of Food in the Last Year: Never true  Transportation Needs: No Transportation Needs (12/17/2022)   PRAPARE - Transportation  Lack of Transportation (Medical): No    Lack of Transportation (Non-Medical): No  Physical Activity: Sufficiently Active (08/03/2017)   Received from [facility], [facility]   Exercise Vital Sign    Days of Exercise per Week: 7 days    Minutes of Exercise per Session: 90 min  Stress: No Stress Concern Present (08/03/2017)   Received from [facility], [facility] - Occupational Stress Questionnaire    Feeling of Stress : Not at all  Social Connections: Unknown (05/21/2021)   Received from [facility], [facility]   Social Network    Social Network: Not on file  Intimate Partner Violence: Not At Risk (12/17/2022)   Humiliation, Afraid, Rape, and Kick questionnaire    Fear of Current or Ex-Partner: No    Emotionally Abused: No    Physically Abused: No    Sexually Abused: No    Objective   BP (!) 157/77 (BP Location: Left Arm, Patient Position: Sitting, Cuff Size: Large)   Pulse (!) 52   Ht 5\' 6"  (1.676 m)   Wt 210 lb (95.3 kg)   SpO2 98%   BMI 33.89 kg/m   Physical Exam  73 year old male in no acute distress Cardiovascular exam with regular rate and rhythm Lungs clear to auscultation bilaterally  Assessment & Plan:   Acquired hypothyroidism Assessment & Plan: Can continue with current dosing of levothyroxine.  We will plan to check TSH with labs shortly before next appointment   Hyperlipidemia, unspecified hyperlipidemia  type Assessment & Plan: No change to medication regimen today, can continue with rosuvastatin as well as ezetimibe and fenofibrate.  Plan to check lipid panel before next appointment   Primary hypertension Assessment & Plan: Initial blood pressure reading elevated in the office today.  He reports that he has had normal readings at appointments with other providers.  Not currently utilizing any medications.  Recommend intermittent monitoring of blood pressure at home, DASH diet.  Consider blood pressure medication in the future if blood pressure continues to be above goal  Orders: -     CBC with Differential/Platelet -     Basic metabolic panel with GFR  Anemia, unspecified type -     CBC with Differential/Platelet -     Basic metabolic panel with GFR  Wellness examination -     CBC with Differential/Platelet; Future -     Comprehensive metabolic panel with GFR; Future -     Hemoglobin A1c; Future -     Lipid panel; Future -     TSH Rfx on Abnormal to Free T4; Future  Seborrheic dermatitis Assessment & Plan: Areas of concern are consistent with seborrheic dermatitis.  Recommend beginning with ketoconazole 2% cream.  He reports that he has tried shampoo in the past for issues related to his scalp but did not have good response to this.  We will try cream for recurrent affected areas of skin.  If not responding as expected, switch to alternative medication.  He will let us know if he has not noticed response within the next few weeks   Other orders -     Ketoconazole; Apply 1 Application topically 2 (two) times daily. To affected areas.  Dispense: 60 g; Refill: 3  Mild anemia noted with recent hospitalization.  Has not had follow-up labs.  We can check labs today for monitoring  Return in about 6 months (around 10/20/2023) for CPE with fasting labs 1 week prior.  ___________________________________________ Satoru Milich [name], MD, ABFM, CAQSM Primary Care and Sports Medicine [facility]

## 2023-04-20 NOTE — Patient Instructions (Signed)
  Medication Instructions:  Your physician recommends that you continue on your current medications as directed. Please refer to the Current Medication list given to you today. --If you need a refill on any your medications before your next appointment, please call your pharmacy first. If no refills are authorized on file call the office.-- Lab Work: Your physician has recommended that you have lab work today: today / 1 week before next visit  If you have labs (blood work) drawn today and your tests are completely normal, you will receive your results via MyChart message OR a phone call from our staff.  Please ensure you check your voicemail in the event that you authorized detailed messages to be left on a delegated number. If you have any lab test that is abnormal or we need to change your treatment, we will call you to review the results.    Follow-Up: Your next appointment:   Your physician recommends that you schedule a follow-up appointment in: 6 months physical  with Dr. [name]  You will receive a text message or e-mail with a link to a survey about your care and experience with [place] today! We would greatly appreciate your feedback!   Thanks for letting us be apart of your health journey!!  Primary Care and Sports Medicine   Dr. Ceasar Mons [place]   We encourage you to activate your patient portal called "MyChart".  Sign up information is provided on this After Visit Summary.  MyChart is used to connect with patients for Virtual Visits (Telemedicine).  Patients are able to view lab/test results, encounter notes, upcoming appointments, etc.  Non-urgent messages can be sent to your provider as well. To learn more about what you can do with MyChart, please visit --  [email or web address].

## 2023-04-20 NOTE — Assessment & Plan Note (Signed)
 Can continue with current dosing of levothyroxine.  We will plan to check TSH with labs shortly before next appointment

## 2023-04-20 NOTE — Assessment & Plan Note (Signed)
 No change to medication regimen today, can continue with rosuvastatin as well as ezetimibe and fenofibrate.  Plan to check lipid panel before next appointment

## 2023-04-20 NOTE — Assessment & Plan Note (Signed)
 Areas of concern are consistent with seborrheic dermatitis.  Recommend beginning with ketoconazole 2% cream.  He reports that he has tried shampoo in the past for issues related to his scalp but did not have good response to this.  We will try cream for recurrent affected areas of skin.  If not responding as expected, switch to alternative medication.  He will let us know if he has not noticed response within the next few weeks

## 2023-04-20 NOTE — Assessment & Plan Note (Signed)
 Initial blood pressure reading elevated in the office today.  He reports that he has had normal readings at appointments with other providers.  Not currently utilizing any medications.  Recommend intermittent monitoring of blood pressure at home, DASH diet.  Consider blood pressure medication in the future if blood pressure continues to be above goal

## 2023-04-25 ENCOUNTER — Encounter (HOSPITAL_BASED_OUTPATIENT_CLINIC_OR_DEPARTMENT_OTHER): Payer: Self-pay | Admitting: Family Medicine

## 2023-05-02 ENCOUNTER — Other Ambulatory Visit (HOSPITAL_BASED_OUTPATIENT_CLINIC_OR_DEPARTMENT_OTHER): Payer: Self-pay | Admitting: Family Medicine

## 2023-05-22 ENCOUNTER — Other Ambulatory Visit (HOSPITAL_BASED_OUTPATIENT_CLINIC_OR_DEPARTMENT_OTHER): Payer: Self-pay | Admitting: Family Medicine

## 2023-05-23 ENCOUNTER — Ambulatory Visit (HOSPITAL_BASED_OUTPATIENT_CLINIC_OR_DEPARTMENT_OTHER): Admitting: Family Medicine

## 2023-06-19 ENCOUNTER — Ambulatory Visit: Admitting: Podiatry

## 2023-06-19 ENCOUNTER — Ambulatory Visit (INDEPENDENT_AMBULATORY_CARE_PROVIDER_SITE_OTHER)

## 2023-06-19 DIAGNOSIS — M25572 Pain in left ankle and joints of left foot: Secondary | ICD-10-CM | POA: Diagnosis not present

## 2023-06-19 DIAGNOSIS — B351 Tinea unguium: Secondary | ICD-10-CM | POA: Diagnosis not present

## 2023-06-19 DIAGNOSIS — M10072 Idiopathic gout, left ankle and foot: Secondary | ICD-10-CM

## 2023-06-19 DIAGNOSIS — L603 Nail dystrophy: Secondary | ICD-10-CM | POA: Diagnosis not present

## 2023-06-19 DIAGNOSIS — M25472 Effusion, left ankle: Secondary | ICD-10-CM | POA: Diagnosis not present

## 2023-06-19 DIAGNOSIS — M7752 Other enthesopathy of left foot: Secondary | ICD-10-CM

## 2023-06-19 DIAGNOSIS — L608 Other nail disorders: Secondary | ICD-10-CM | POA: Diagnosis not present

## 2023-06-19 NOTE — Progress Notes (Signed)
  Subjective:  Patient ID: Eric Cordova, male    DOB: 29-Dec-1950,  MRN: [id number]  Chief Complaint  Patient presents with   Ankle Pain    RM#14 Left ankle pain and swelling no recent injuries started 2 weeks ago.Right foot big toe has a black spot under nail.    Discussed the use of AI scribe software for clinical note transcription with the patient, who gave verbal consent to proceed.  History of Present Illness Eric Cordova "Eric Cordova" is a 73 year old male with atrial fibrillation who presents with left ankle swelling and pain.  Swelling and pain in the left ankle have persisted for two to three weeks, with swelling primarily on the lateral aspect and pain on the dorsal side.  There is no recent injury or trauma, and no treatment has been initiated. There is no pain radiating up the leg and no history of gout.  He has noticed a black discoloration on his right big toenail.  No pain.  No swelling or redness or any drainage.      Objective:    Physical Exam General: AAO x3, NAD  Dermatological: On the right hallux there is some dark discoloration present distal portion of the toenail.  1 to debride the nail there is no extension of any hyperpigmentation in the surrounding skin.  There is no erythema or warmth.  This did appear to be within the toenail itself which could represent dried blood, debris.   Vascular: Dorsalis Pedis artery and Posterior Tibial artery pedal pulses are 2/4 bilateral with immedate capillary fill time.  There is no pain with calf compression, swelling, warmth, erythema.   Neruologic: Grossly intact via light touch bilateral.   Musculoskeletal: There is tenderness palpation of lateral aspect of the left ankle, subtalar joint.  There is no erythema or warmth.  There is no swelling into the calf and the calf is supple.  There is no erythema or warmth of the calf.    No images are attached to the encounter.    Results   RADIOLOGY Ankle X-ray:  There is no evidence of acute fracture noted.  Joint space maintained in the ankle.  On the lateral view the subtalar joint with some mild narrowing.  (06/19/2023)   Assessment:   1. Capsulitis of left ankle   2. Acute idiopathic gout of left ankle   3. Dermatophytosis of nail      Plan:  Patient was evaluated and treated and all questions answered.  Assessment and Plan Assessment & Plan Left ankle capsulitis Persistent lateral swelling and pain. Stress fracture possible.  Blood work planned to exclude gout. NSAIDs avoided due to GI bleed and atrial fibrillation history. Prednisone  declined due to side effects. - Order blood work to rule out gout and other conditions. - Apply Uniboot compression wrap for up to 5 days to reduce swelling and inflammation. - Provide walking boot for ankle immobilization. - Advise against mowing the lawn while wearing the Uniboot. - Instruct to remove Uniboot if wet, dirty, or uncomfortable.  Right hallux toenail discoloration - Sharply debride the nail and complicated wound as it is for culture, pathology to [facility].   Gastrointestinal bleed Significant blood loss in December. NSAIDs contraindicated. - Avoid NSAIDs due to risk of gastrointestinal bleed.    Return for 2-3 weeks for left ankle swelling .   Charity Conch DPM

## 2023-06-20 LAB — CBC WITH DIFFERENTIAL/PLATELET
Basophils Absolute: 0 10*3/uL (ref 0.0–0.2)
Basos: 1 %
EOS (ABSOLUTE): 0.2 10*3/uL (ref 0.0–0.4)
Eos: 2 %
Hematocrit: 49.3 % (ref 37.5–51.0)
Hemoglobin: 14.6 g/dL (ref 13.0–17.7)
Immature Grans (Abs): 0 10*3/uL (ref 0.0–0.1)
Immature Granulocytes: 0 %
Lymphocytes Absolute: 1.1 10*3/uL (ref 0.7–3.1)
Lymphs: 14 %
MCH: 26.2 pg — ABNORMAL LOW (ref 26.6–33.0)
MCHC: 29.6 g/dL — ABNORMAL LOW (ref 31.5–35.7)
MCV: 89 fL (ref 79–97)
Monocytes Absolute: 0.7 10*3/uL (ref 0.1–0.9)
Monocytes: 9 %
Neutrophils Absolute: 6.3 10*3/uL (ref 1.4–7.0)
Neutrophils: 74 %
Platelets: 180 10*3/uL (ref 150–450)
RBC: 5.57 x10E6/uL (ref 4.14–5.80)
RDW: 15.3 % (ref 11.6–15.4)
WBC: 8.4 10*3/uL (ref 3.4–10.8)

## 2023-06-20 LAB — BASIC METABOLIC PANEL WITH GFR
BUN/Creatinine Ratio: 16 (ref 10–24)
BUN: 15 mg/dL (ref 8–27)
CO2: 18 mmol/L — ABNORMAL LOW (ref 20–29)
Calcium: 10 mg/dL (ref 8.6–10.2)
Chloride: 102 mmol/L (ref 96–106)
Creatinine, Ser: 0.95 mg/dL (ref 0.76–1.27)
Glucose: 85 mg/dL (ref 70–99)
Potassium: 4.4 mmol/L (ref 3.5–5.2)
Sodium: 142 mmol/L (ref 134–144)
eGFR: 85 mL/min/{1.73_m2} (ref >59)

## 2023-06-20 LAB — URIC ACID: Uric Acid: 3.5 mg/dL — ABNORMAL LOW (ref 3.8–8.4)

## 2023-06-22 ENCOUNTER — Ambulatory Visit: Admitting: Podiatry

## 2023-06-23 ENCOUNTER — Ambulatory Visit: Payer: Self-pay | Admitting: Podiatry

## 2023-06-23 ENCOUNTER — Other Ambulatory Visit: Payer: Self-pay | Admitting: Podiatry

## 2023-06-23 DIAGNOSIS — R899 Unspecified abnormal finding in specimens from other organs, systems and tissues: Secondary | ICD-10-CM

## 2023-07-07 ENCOUNTER — Ambulatory Visit

## 2023-07-07 ENCOUNTER — Other Ambulatory Visit: Payer: Self-pay

## 2023-07-07 ENCOUNTER — Ambulatory Visit: Admitting: Podiatry

## 2023-07-07 ENCOUNTER — Ambulatory Visit (INDEPENDENT_AMBULATORY_CARE_PROVIDER_SITE_OTHER)

## 2023-07-07 DIAGNOSIS — M7752 Other enthesopathy of left foot: Secondary | ICD-10-CM | POA: Diagnosis not present

## 2023-07-07 DIAGNOSIS — R609 Edema, unspecified: Secondary | ICD-10-CM

## 2023-07-07 DIAGNOSIS — M7751 Other enthesopathy of right foot: Secondary | ICD-10-CM

## 2023-07-07 NOTE — Progress Notes (Unsigned)
  Subjective:  Patient ID: Eric Cordova, male    DOB: May 20, 1950,  MRN: [id number]  Chief Complaint  Patient presents with   Ankle Pain    RM#11 Left ankle pain on occasion states had injury when he was a teenager. Swelling has gone down no pain today.     History of Present Illness Hatem Hernandez Losasso is a 73 year old male with atrial fibrillation who presents for follow-up evaluation of left ankle swelling and pain.  States that he is doing well and is not having any pain.  He does recall an injury from when he was much younger in which he twisted his ankle.  He is not sure if that is starting to show from the old injury but otherwise he has no new injuries.  No increasing swelling or redness. No open lesions. No other concerns.     Objective:    Physical Exam General: AAO x3, NAD  Dermatological: No open lesions identified this time.  Vascular: Dorsalis Pedis artery and Posterior Tibial artery pedal pulses are 2/4 bilateral with immedate capillary fill time.  There is no pain with calf compression, swelling, warmth, erythema.   Neruologic: Grossly intact via light touch bilateral.   Musculoskeletal: Today there is no tenderness to palpation of lateral aspect of the left ankle, subtalar joint.  There is no erythema or warmth.  Mild swelling to the lateral aspect of the left Cordova there is no erythema or warmth.  Calf is supple and there is no pain or swelling into the calf.   No images are attached to the encounter.    Results   RADIOLOGY Ankle X-ray: There is no evidence of acute fracture noted.  Joint space maintained in the ankle.  On the lateral view the subtalar joint with some mild narrowing.  No acute changes.   Assessment:   Capsulitis left ankle Swelling   Plan:  Patient was evaluated and treated and all questions answered.  Assessment and Plan Assessment & Plan Left ankle capsulitis -Overall symptoms have improved.  Currently not having any  pain.  To help control the swelling dispensed a compression anklet we also discussed compression socks and a prescription was given for this.  Continue to ice and elevate.  Monitor any signs or symptoms of reoccurrence until we know immediately should any occur.   Donnice JONELLE Fees DPM

## 2023-07-11 ENCOUNTER — Telehealth (HOSPITAL_BASED_OUTPATIENT_CLINIC_OR_DEPARTMENT_OTHER): Payer: Self-pay | Admitting: *Deleted

## 2023-07-11 NOTE — Telephone Encounter (Signed)
 Copied from CRM [id number]. Topic: Referral - Request for Referral >> Jul 11, 2023  3:24 PM Nathanel BROCKS wrote: Did the patient discuss referral with their provider in the last year? Yes (If No - schedule appointment) (If Yes - send message)  Appointment offered? No  Type of order/referral and detailed reason for visit:  Pain management/Neurologist  Preference of office, provider, location: Dr Deatrice Manus [facility] [place]  If referral order, have you been seen by this specialty before? No (If Yes, this issue or another issue? When? Where?  Can we respond through MyChart? Yes

## 2023-07-11 NOTE — Telephone Encounter (Signed)
Would you like to refer to pain management??

## 2023-07-12 ENCOUNTER — Telehealth (HOSPITAL_BASED_OUTPATIENT_CLINIC_OR_DEPARTMENT_OTHER): Payer: Self-pay | Admitting: *Deleted

## 2023-07-12 ENCOUNTER — Other Ambulatory Visit (HOSPITAL_BASED_OUTPATIENT_CLINIC_OR_DEPARTMENT_OTHER): Payer: Self-pay | Admitting: *Deleted

## 2023-07-12 DIAGNOSIS — M5432 Sciatica, left side: Secondary | ICD-10-CM

## 2023-07-12 DIAGNOSIS — M179 Osteoarthritis of knee, unspecified: Secondary | ICD-10-CM

## 2023-07-12 NOTE — Telephone Encounter (Signed)
 Spoke with Patient agreeable to referral. Referral placed for [place] [facility]. Advised patient if he does not hear from that office in 1 week to give us  a call and we will be happy to reach out to them with verbal understanding

## 2023-07-12 NOTE — Telephone Encounter (Signed)
 Patient was seeing accupuncture specialist for sciatic issues and feels like this is no longer working

## 2023-07-12 NOTE — Telephone Encounter (Signed)
 Referral sent to [facility]

## 2023-07-12 NOTE — Telephone Encounter (Signed)
 Copied from CRM [id number]. Topic: Referral - Request for Referral >> Jul 11, 2023  3:24 PM Eric Cordova wrote: Did the patient discuss referral with their provider in the last year? Yes (If No - schedule appointment) (If Yes - send message)  Appointment offered? No  Type of order/referral and detailed reason for visit:  Pain management/Neurologist  Preference of office, provider, location: Dr Deatrice Manus [facility] [place]  If referral order, have you been seen by this specialty before? No (If Yes, this issue or another issue? When? Where?  Can we respond through MyChart? Yes

## 2023-07-18 DIAGNOSIS — K080 Exfoliation of teeth due to systemic causes: Secondary | ICD-10-CM | POA: Diagnosis not present

## 2023-07-19 ENCOUNTER — Telehealth (HOSPITAL_BASED_OUTPATIENT_CLINIC_OR_DEPARTMENT_OTHER): Payer: Self-pay | Admitting: *Deleted

## 2023-07-19 NOTE — Telephone Encounter (Signed)
 Copied from CRM [id number]. Topic: Referral - Status >> Jul 19, 2023  3:38 PM Sasha H wrote: Reason for CRM: Pt is wanting to know the status of the referral to Dr Deatrice Manus, he is only dr at that practice he is wanting to see. Please reach out to Pt

## 2023-07-19 NOTE — Telephone Encounter (Signed)
 Please let patient know this was sent to [facility]. He can call that office and let them know that is the only provider he would like to see.

## 2023-07-25 ENCOUNTER — Telehealth (HOSPITAL_BASED_OUTPATIENT_CLINIC_OR_DEPARTMENT_OTHER): Payer: Self-pay | Admitting: Family Medicine

## 2023-07-25 NOTE — Telephone Encounter (Signed)
 Spoke with patient let him know appt with provider was 10/10 this is appt with nurse he wanted to cancel appt appt cancelled for patient

## 2023-07-25 NOTE — Telephone Encounter (Signed)
 E2c2 had multiple calls from this patient stating he will NOT attend the AWV via phone call he will make that an in person appointment.  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  He states he wants his doctor to listen to his heartbeat and its impossible to check his Afib on the phone.  He would like for someone to call him back. Please advise. Thank you.

## 2023-08-01 ENCOUNTER — Encounter (HOSPITAL_BASED_OUTPATIENT_CLINIC_OR_DEPARTMENT_OTHER)

## 2023-08-03 ENCOUNTER — Ambulatory Visit: Admission: EM | Admit: 2023-08-03 | Discharge: 2023-08-03 | Disposition: A | Discharge status: Home / Self Care

## 2023-08-03 ENCOUNTER — Encounter: Payer: Self-pay | Admitting: Emergency Medicine

## 2023-08-03 ENCOUNTER — Ambulatory Visit: Payer: Self-pay

## 2023-08-03 DIAGNOSIS — R21 Rash and other nonspecific skin eruption: Secondary | ICD-10-CM

## 2023-08-03 MED ORDER — PERMETHRIN 5 % EX CREA: TOPICAL_CREAM | CUTANEOUS | 1 refills | Status: AC

## 2023-08-03 MED ORDER — PREDNISONE 10 MG (21) PO TBPK: ORAL_TABLET | Freq: Every day | ORAL | 0 refills | Status: AC

## 2023-08-03 NOTE — Telephone Encounter (Signed)
 FYI Only or Action Required?: FYI only for provider.  Patient was last seen in primary care on 04/20/2023 by [name], [name], MD.  Called Nurse Triage reporting Insect Bite.  Symptoms began yesterday.  Interventions attempted: Rest, hydration, or home remedies.  Symptoms are: gradually worsening.  Triage Disposition: See Physician Within 24 Hours  Patient/caregiver understands and will follow disposition?: Yes                             Copied from CRM #[id number]. Topic: Clinical - Red Word Triage >> Aug 03, 2023 11:25 AM Donna BRAVO wrote: Red Word that prompted transfer to Nurse Triage: patient was at the pharmacy this morning and showed pharmacist bites, bites started showing up yesterday, now the bites are beat red, some are the size of quarters, itches like crazy pharmacist stated it's potentially scabies Reason for Disposition  [1] Red or very tender (to touch) area AND [2] started over 24 hours after the bite  Answer Assessment - Initial Assessment Questions 1. TYPE of INSECT: What type of insect was it?      Possibly scabies  2. ONSET: When did you get bitten?      Wednesday  3. LOCATION: Where is the insect bite located?      Arms and legs 4. REDNESS: Is the area red or pink? If Yes, ask: What size is the area of redness? (inches or cm). When did the redness start?     Redness 5. PAIN: Is there any pain? If Yes, ask: How bad is the pain? (Scale 0-10; or none, mild, moderate, severe)     Itching more than painful 6. ITCHING: Does it itch? If Yes, ask: How bad is the itch?      Yes 7. SWELLING: How big is the swelling? (e.g., inches, cm, or compare to coins)     Some of the bites are puffed up  8. OTHER SYMPTOMS: Do you have any other symptoms?  (e.g., difficulty breathing, fever, hives)     Denies difficulty breathing, denies chest pain, denies fever    Bites are size of dime to quarter. Estimates about 42 bites.  Applying rubbing alcohol. No availability in office until end of August. Advised UC. Patient agreed to go.  Protocols used: Insect Bite-A-AH

## 2023-08-03 NOTE — ED Provider Notes (Signed)
 Eric Cordova    CSN: [id number] Arrival date & time: 08/03/23  1216      History   Chief Complaint Chief Complaint  Patient presents with   Rash    HPI Eric Cordova is a 73 y.o. male.   Patient presents for evaluation of erythematous rash present to the bilateral upper and lower extremities into the back beginning 2 to 3 days ago.  Visited client with unclean home 1 day prior to symptoms beginning.  Endorses significant pruritus but denies drainage, pain or fever.  Denies changes in toiletries, diet or medications rash was seen by her friend who had concern for scabies.  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    w/ irritation   Pneumonia     Patient Active Problem List   Diagnosis Date Noted   Anemia due to acute blood loss 04/20/2023   Change in bowel habit 04/20/2023   Diarrhea 04/20/2023   Diverticulitis of colon 04/20/2023   Flatulence, eructation and gas pain 04/20/2023   Left lower quadrant pain 04/20/2023   Periumbilical pain 04/20/2023   Nausea and vomiting 04/20/2023   GI bleed 12/17/2022   Rectal bleed 12/17/2022   Sciatica 07/21/2022   Ingrown toenail 10/18/2021   Paroxysmal atrial fibrillation (HCC) 05/25/2020   Screening for malignant neoplasm of colon 05/25/2020   Urgency of urination 05/25/2020   Body mass index (BMI) 34.0-34.9, adult 05/25/2020   History of colon polyps 08/15/2018   Seborrheic dermatitis 02/14/2018   Hyperlipidemia 08/03/2017   Acquired hypothyroidism 08/03/2017   Allergy 08/03/2017   Degenerative joint disease 08/03/2017   Asthma 08/03/2017   Hypertension 08/03/2017   Prediabetes 08/03/2017   Primary insomnia 08/03/2017   Lichen sclerosus-associated penile intraepithelial neoplasia  02/16/2015   Vitamin D  deficiency 12/03/2012   Incisional hernia 08/16/2011   Postop check 11/30/2010   Diverticular disease of colon 10/19/2010   ABDOMINAL PAIN, GENERALIZED 03/04/2009   DIARRHEA-PRESUMED INFECTIOUS 02/19/2009   DIVERTICULITIS-COLON 02/19/2009   ABNORMAL FINDINGS GI TRACT 02/19/2009    Past Surgical History:  Procedure Laterality Date   COLON SURGERY     CYSTOSCOPY N/A 12/26/2014   Procedure: CYSTOSCOPY;  Surgeon: Ricardo Likens, MD;  Location: [facility];  Service: Urology;  Laterality: N/A;   HERNIA REPAIR  1960, 1974   inguinal bilateral   HYDROCELE EXCISION Right 1984   INGUINAL HERNIA REPAIR Bilateral 1960 & 1974   INSERTION OF MESH  11/07/2011   Procedure: INSERTION OF MESH;  Surgeon: Lynwood MALVA Pina, MD;  Location: [facility] OR;  Service: General;  Laterality: N/A;   LAPAROSCOPIC SIGMOID COLECTOMY  11-12-2010   diverticulitis   NASAL SEPTOPLASTY W/ TURBINOPLASTY Bilateral 09/11/2018   Procedure: NASAL SEPTOPLASTY WITH BILATERAL TURBINATE REDUCTION;  Surgeon: Karis Clunes, MD;  Location: [facility];  Service: ENT;  Laterality: Bilateral;   NASAL SEPTUM SURGERY  1982   PENILE BIOPSY N/A 12/26/2014   Procedure: PENILE  SKIN BIOPSY WITH PENILE BLOCK;  Surgeon: Ricardo Likens, MD;  Location: [facility];  Service: Urology;  Laterality: N/A;   PILONIDAL CYST EXCISION  1973   TONSILLECTOMY  1958   VENTRAL HERNIA REPAIR  11/07/2011   Procedure: LAPAROSCOPIC VENTRAL HERNIA;  Surgeon: Lynwood MALVA Pina, MD;  Location: [facility] OR;  Service: General;  Laterality: N/A;       Home Medications    Prior to Admission medications   Medication Sig Start Date End Date Taking? Authorizing Provider  Cholecalciferol  (VITAMIN D -3 PO) Take 1 tablet by mouth daily.   Yes [provider]  permethrin  (ELIMITE ) 5 % cream Massage topical cream into the skin from the head to the soles of the feet, leave on for 8 to 14 hours then wash off 08/03/23   Yes Seleni Meller R, NP  predniSONE  (STERAPRED UNI-PAK 21 TAB) 10 MG (21) TBPK tablet Take by mouth daily. Take 6 tabs by mouth daily  for 1 days, then 5 tabs for 1 days, then 4 tabs for 1 days, then 3 tabs for 1 days, 2 tabs for 1 days, then 1 tab by mouth daily for 1 days 08/03/23  Yes [name], NP  Probiotic Product (ALIGN PO) Take 1 tablet by mouth daily.   Yes [provider]  Acetaminophen  (TYLENOL ) 325 MG CAPS Take 2 capsules by mouth daily as needed.    [provider]  aspirin  EC 81 MG tablet Take 1 tablet (81 mg total) by mouth at bedtime. Swallow whole. 12/26/22   [name], [name], MD  bismuth subsalicylate (PEPTO BISMOL) 262 MG/15ML suspension Take 30 mLs by mouth every 6 (six) hours as needed for indigestion or diarrhea or loose stools.    [provider]  ezetimibe  (ZETIA ) 10 MG tablet TAKE ONE TABLET BY MOUTH ONCE DAILY 05/02/23   [name], [name], MD  fenofibrate  (TRICOR ) 145 MG tablet TAKE ONE TABLET BY MOUTH ONCE DAILY 05/22/23   [name], [name], MD  ketoconazole  (NIZORAL ) 2 % cream Apply 1 Application topically 2 (two) times daily. To affected areas. 04/20/23   [name], [name], MD  levothyroxine  (SYNTHROID , LEVOTHROID) 50 MCG tablet Take 50-75 mcg by mouth daily before breakfast. 1.5 tablet on Monday, Wednesday and Friday; 1 tablet all other days of the week    [provider]  magnesium  gluconate (MAGONATE) 500 MG tablet Take 1,000 mg by mouth at bedtime.    [provider]  Melatonin 10 MG CAPS Take 10 mg by mouth daily as needed (sleep).    [provider]  Multiple Vitamins-Minerals (PRESERVISION AREDS 2 PO) Take 1 capsule by mouth 2 (two) times daily.    [provider]  rosuvastatin  (CRESTOR ) 20 MG tablet TAKE ONE TABLET BY MOUTH DAILY Patient taking differently: Take 20 mg by mouth daily. 04/02/21   [name], MD  Zinc Sulfate (ZINC 15 PO) Take 1 tablet by mouth 2 (two) times daily.     [provider]    Family History Family History  Problem Relation Age of Onset   Cancer Mother        breast   Thyroid  disease Mother    Asthma Mother    Cancer Father        prostate   Dementia Father    Heart disease Father    Cancer Maternal Uncle        prostate   Hypertension Brother    Arthritis Maternal Grandmother     Social History Social History   Tobacco Use   Smoking status: Never    Passive exposure: Never   Smokeless tobacco: Never  Vaping Use   Vaping status: Former  Substance Use Topics   Alcohol use: Yes    Alcohol/week:  7.0 - 14.0 standard drinks of alcohol    Types: 7 - 14 Shots of liquor per week    Comment: 1 to 2  shots daily   Drug use: No     Allergies   Statins and Atorvastatin   Review of Systems Review of Systems  Skin:  Positive for rash.     Physical Exam Triage Vital Signs ED Triage Vitals  Encounter Vitals Group     BP 08/03/23 1304 132/78     Girls Systolic BP Percentile --      Girls Diastolic BP Percentile --      Boys Systolic BP Percentile --      Boys Diastolic BP Percentile --      Pulse Rate 08/03/23 1304 60     Resp 08/03/23 1254 18     Temp 08/03/23 1254 98 F (36.7 C)     Temp src --      SpO2 08/03/23 1254 98 %     Weight --      Height --      Head Circumference --      Peak Flow --      Pain Score 08/03/23 1257 0     Pain Loc --      Pain Education --      Exclude from Growth Chart --    No data found.  Updated Vital Signs BP 132/78 (BP Location: Left Arm)   Pulse 60   Temp 98 F (36.7 C)   Resp 18   SpO2 98%   Visual Acuity Right Eye Distance:   Left Eye Distance:   Bilateral Distance:    Right Eye Near:   Left Eye Near:    Bilateral Near:     Physical Exam Constitutional:      Appearance: Normal appearance.  Eyes:     Extraocular Movements: Extraocular movements intact.  Pulmonary:     Effort: Pulmonary effort is normal.  Skin:    Comments: Erythematous  maculopapular rash present to the bilateral extremities and 1 lesion present to the left lower back, tracking patterned noted  Neurological:     Mental Status: He is alert and oriented to person, place, and time. Mental status is at baseline.      UC Treatments / Results  Labs (all labs ordered are listed, but only abnormal results are displayed) Labs Reviewed - No data to display  EKG   Radiology No results found.  Procedures Procedures (including critical care time)  Medications Ordered in UC Medications - No data to display  Initial Impression / Assessment and Plan / UC Course  I have reviewed the triage vital signs and the nursing notes.  Pertinent labs & imaging results that were available during my care of the patient were reviewed by me and considered in my medical decision making (see chart for details).  Rash  Appears inflammatory concerning for insect bites due to tracking pattern, no signs of infection will provide coverage for mites, prescribed permethrin  and discussed administration, prescribed prednisone  for management of inflammation and itching, minute supportive care for management of itching and advised follow-up if symptoms continue to persist Final Clinical Impressions(s) / UC Diagnoses   Final diagnoses:  Rash     Discharge Instructions      Today the presentation of your rash is concerning for bug bites and therefore you will be treated as such since you were recently in a unclean environment, low suspicion that this is a bacterial infection  as it is spreading and typically this gets worse in 1 location causing redness swelling heat drainage and fever, low suspicion for shingles as rash is present on both sides of the body  Use permethrin  today, lather at the body and leave on for 12 hours, easiest to do overnight then wash off, if rash still present in 1 week, repeat  Begin prednisone  every morning with food as directed to reduce inflammation and  help with itching  For severe itching you may use allergy medicine such as Claritin  Zyrtec or Benadryl , may also use topical Benadryl  or calamine lotion over the affected area  Heat can cause irritation to the skin making it feel more inflamed or itchy, be mindful in the shower and outdoor in the sun  Place all clothing that you will wear since symptoms began in the day of possible exposure into a plastic bag insect and set out in the garage for 24 hours, this heat helps to suffocate any lingering might, then wash her close is normal, after completion of permethrin  wash your bed sheets  Follow-up with urgent care as needed   ED Prescriptions     Medication Sig Dispense Auth. Provider   permethrin  (ELIMITE ) 5 % cream Massage topical cream into the skin from the head to the soles of the feet, leave on for 8 to 14 hours then wash off 60 g Tirth Cothron R, NP   predniSONE  (STERAPRED UNI-PAK 21 TAB) 10 MG (21) TBPK tablet Take by mouth daily. Take 6 tabs by mouth daily  for 1 days, then 5 tabs for 1 days, then 4 tabs for 1 days, then 3 tabs for 1 days, 2 tabs for 1 days, then 1 tab by mouth daily for 1 days 21 tablet Corrado Hymon, Shelba SAUNDERS, NP      PDMP not reviewed this encounter.   Teresa Shelba SAUNDERS, NP 08/03/23 1409

## 2023-08-03 NOTE — ED Triage Notes (Signed)
 Patient reports red itchy bumps all over body x 2 days. Denies pain. Patient states he used rubbing alcohol on it. Patient states he was working in [employer] and house was dirty. Patient states I thought it was flea bites but a pharmacy friend told him it mite be scabies.

## 2023-08-03 NOTE — Discharge Instructions (Signed)
 Today the presentation of your rash is concerning for bug bites and therefore you will be treated as such since you were recently in a unclean environment, low suspicion that this is a bacterial infection as it is spreading and typically this gets worse in 1 location causing redness swelling heat drainage and fever, low suspicion for shingles as rash is present on both sides of the body  Use permethrin  today, lather at the body and leave on for 12 hours, easiest to do overnight then wash off, if rash still present in 1 week, repeat  Begin prednisone  every morning with food as directed to reduce inflammation and help with itching  For severe itching you may use allergy medicine such as Claritin  Zyrtec or Benadryl , may also use topical Benadryl  or calamine lotion over the affected area  Heat can cause irritation to the skin making it feel more inflamed or itchy, be mindful in the shower and outdoor in the sun  Place all clothing that you will wear since symptoms began in the day of possible exposure into a plastic bag insect and set out in the garage for 24 hours, this heat helps to suffocate any lingering might, then wash her close is normal, after completion of permethrin  wash your bed sheets  Follow-up with urgent care as needed

## 2023-09-06 DIAGNOSIS — M5416 Radiculopathy, lumbar region: Secondary | ICD-10-CM | POA: Diagnosis not present

## 2023-09-12 ENCOUNTER — Other Ambulatory Visit (HOSPITAL_BASED_OUTPATIENT_CLINIC_OR_DEPARTMENT_OTHER): Payer: Self-pay | Admitting: Family Medicine

## 2023-09-12 DIAGNOSIS — E785 Hyperlipidemia, unspecified: Secondary | ICD-10-CM

## 2023-09-14 ENCOUNTER — Other Ambulatory Visit: Payer: Self-pay | Admitting: Pain Medicine

## 2023-09-14 ENCOUNTER — Encounter: Payer: Self-pay | Admitting: Pain Medicine

## 2023-09-14 DIAGNOSIS — M5416 Radiculopathy, lumbar region: Secondary | ICD-10-CM

## 2023-09-29 ENCOUNTER — Ambulatory Visit
Admission: RE | Admit: 2023-09-29 | Discharge: 2023-09-29 | Disposition: A | Discharge status: Home / Self Care | Source: Ambulatory Visit | Attending: Pain Medicine | Admitting: Pain Medicine

## 2023-09-29 ENCOUNTER — Other Ambulatory Visit

## 2023-09-29 DIAGNOSIS — M5416 Radiculopathy, lumbar region: Secondary | ICD-10-CM

## 2023-10-02 DIAGNOSIS — K080 Exfoliation of teeth due to systemic causes: Secondary | ICD-10-CM | POA: Diagnosis not present

## 2023-10-13 ENCOUNTER — Other Ambulatory Visit (HOSPITAL_BASED_OUTPATIENT_CLINIC_OR_DEPARTMENT_OTHER)

## 2023-10-20 ENCOUNTER — Encounter (HOSPITAL_BASED_OUTPATIENT_CLINIC_OR_DEPARTMENT_OTHER): Admitting: Family Medicine
